# Patient Record
Sex: Female | Born: 1985 | Hispanic: Yes | Marital: Married | Smoking: Former smoker
Health system: Southern US, Community
[De-identification: ages and names within clinical notes are randomized; demographics above are authoritative.]

## PROBLEM LIST (undated history)

## (undated) DIAGNOSIS — E785 Hyperlipidemia, unspecified: Secondary | ICD-10-CM

## (undated) DIAGNOSIS — N200 Calculus of kidney: Secondary | ICD-10-CM

## (undated) DIAGNOSIS — K219 Gastro-esophageal reflux disease without esophagitis: Secondary | ICD-10-CM

## (undated) DIAGNOSIS — E119 Type 2 diabetes mellitus without complications: Secondary | ICD-10-CM

## (undated) HISTORY — DX: Hyperlipidemia, unspecified: E78.5

## (undated) HISTORY — DX: Type 2 diabetes mellitus without complications: E11.9

## (undated) HISTORY — DX: Gastro-esophageal reflux disease without esophagitis: K21.9

## (undated) HISTORY — PX: OTHER SURGICAL HISTORY: SHX169

## (undated) HISTORY — DX: Calculus of kidney: N20.0

---

## 2015-12-29 ENCOUNTER — Other Ambulatory Visit (HOSPITAL_COMMUNITY)
Admission: RE | Admit: 2015-12-29 | Discharge: 2015-12-29 | Disposition: A | Discharge status: Home / Self Care | Payer: BLUE CROSS/BLUE SHIELD | Source: Ambulatory Visit | Attending: Obstetrics & Gynecology | Admitting: Obstetrics & Gynecology

## 2015-12-29 DIAGNOSIS — Z029 Encounter for administrative examinations, unspecified: Secondary | ICD-10-CM | POA: Insufficient documentation

## 2015-12-30 LAB — PROGESTERONE: PROGESTERONE: 0.2 ng/mL

## 2016-02-15 DIAGNOSIS — F331 Major depressive disorder, recurrent, moderate: Secondary | ICD-10-CM | POA: Diagnosis not present

## 2016-09-10 DIAGNOSIS — H5211 Myopia, right eye: Secondary | ICD-10-CM | POA: Diagnosis not present

## 2016-09-14 ENCOUNTER — Emergency Department (HOSPITAL_COMMUNITY): Payer: BLUE CROSS/BLUE SHIELD

## 2016-09-14 ENCOUNTER — Encounter (HOSPITAL_COMMUNITY): Payer: Self-pay

## 2016-09-14 ENCOUNTER — Emergency Department (HOSPITAL_COMMUNITY)
Admission: EM | Admit: 2016-09-14 | Discharge: 2016-09-14 | Disposition: A | Discharge status: Home / Self Care | Payer: BLUE CROSS/BLUE SHIELD | Attending: Emergency Medicine | Admitting: Emergency Medicine

## 2016-09-14 DIAGNOSIS — R1011 Right upper quadrant pain: Secondary | ICD-10-CM | POA: Diagnosis not present

## 2016-09-14 DIAGNOSIS — R7989 Other specified abnormal findings of blood chemistry: Secondary | ICD-10-CM

## 2016-09-14 DIAGNOSIS — K760 Fatty (change of) liver, not elsewhere classified: Secondary | ICD-10-CM

## 2016-09-14 DIAGNOSIS — F17200 Nicotine dependence, unspecified, uncomplicated: Secondary | ICD-10-CM | POA: Insufficient documentation

## 2016-09-14 DIAGNOSIS — R945 Abnormal results of liver function studies: Secondary | ICD-10-CM | POA: Diagnosis not present

## 2016-09-14 DIAGNOSIS — R1012 Left upper quadrant pain: Secondary | ICD-10-CM | POA: Diagnosis not present

## 2016-09-14 DIAGNOSIS — R1030 Lower abdominal pain, unspecified: Secondary | ICD-10-CM | POA: Diagnosis present

## 2016-09-14 DIAGNOSIS — N200 Calculus of kidney: Secondary | ICD-10-CM

## 2016-09-14 DIAGNOSIS — N1330 Unspecified hydronephrosis: Secondary | ICD-10-CM | POA: Diagnosis not present

## 2016-09-14 LAB — URINALYSIS, ROUTINE W REFLEX MICROSCOPIC
Bilirubin Urine: NEGATIVE
GLUCOSE, UA: NEGATIVE mg/dL
KETONES UR: NEGATIVE mg/dL
Leukocytes, UA: NEGATIVE
Nitrite: NEGATIVE
PH: 5.5 (ref 5.0–8.0)
PROTEIN: NEGATIVE mg/dL
Specific Gravity, Urine: 1.016 (ref 1.005–1.030)

## 2016-09-14 LAB — COMPREHENSIVE METABOLIC PANEL
ALBUMIN: 3.8 g/dL (ref 3.5–5.0)
ALT: 107 U/L — AB (ref 14–54)
AST: 46 U/L — AB (ref 15–41)
Alkaline Phosphatase: 76 U/L (ref 38–126)
Anion gap: 8 (ref 5–15)
BUN: 7 mg/dL (ref 6–20)
CHLORIDE: 105 mmol/L (ref 101–111)
CO2: 23 mmol/L (ref 22–32)
CREATININE: 0.79 mg/dL (ref 0.44–1.00)
Calcium: 8.6 mg/dL — ABNORMAL LOW (ref 8.9–10.3)
GFR calc Af Amer: >60 mL/min (ref >60)
GFR calc non Af Amer: >60 mL/min (ref >60)
Glucose, Bld: 137 mg/dL — ABNORMAL HIGH (ref 65–99)
Potassium: 4.3 mmol/L (ref 3.5–5.1)
SODIUM: 136 mmol/L (ref 135–145)
Total Bilirubin: 0.4 mg/dL (ref 0.3–1.2)
Total Protein: 6.6 g/dL (ref 6.5–8.1)

## 2016-09-14 LAB — URINE MICROSCOPIC-ADD ON

## 2016-09-14 LAB — LIPASE, BLOOD: LIPASE: 32 U/L (ref 11–51)

## 2016-09-14 LAB — CBC
HCT: 46.2 % — ABNORMAL HIGH (ref 36.0–46.0)
Hemoglobin: 15.9 g/dL — ABNORMAL HIGH (ref 12.0–15.0)
MCH: 31.7 pg (ref 26.0–34.0)
MCHC: 34.4 g/dL (ref 30.0–36.0)
MCV: 92.2 fL (ref 78.0–100.0)
PLATELETS: 252 10*3/uL (ref 150–400)
RBC: 5.01 MIL/uL (ref 3.87–5.11)
RDW: 12.4 % (ref 11.5–15.5)
WBC: 10 10*3/uL (ref 4.0–10.5)

## 2016-09-14 LAB — I-STAT BETA HCG BLOOD, ED (MC, WL, AP ONLY): I-stat hCG, quantitative: <5 m[IU]/mL (ref <5)

## 2016-09-14 MED ORDER — TAMSULOSIN HCL 0.4 MG PO CAPS: 0.4000 mg | ORAL_CAPSULE | Freq: Every day | ORAL | 0 refills | Status: DC

## 2016-09-14 MED ORDER — HYDROCODONE-ACETAMINOPHEN 5-325 MG PO TABS: 2.0000 | ORAL_TABLET | ORAL | 0 refills | Status: DC | PRN

## 2016-09-14 MED ORDER — KETOROLAC TROMETHAMINE 30 MG/ML IJ SOLN
30.0000 mg | Freq: Once | INTRAMUSCULAR | Status: AC
  Administered 2016-09-14: 30 mg via INTRAVENOUS
  Filled 2016-09-14: qty 1

## 2016-09-14 MED ORDER — MORPHINE SULFATE (PF) 4 MG/ML IV SOLN
2.0000 mg | Freq: Once | INTRAVENOUS | Status: AC
  Administered 2016-09-14: 2 mg via INTRAVENOUS
  Filled 2016-09-14: qty 1

## 2016-09-14 MED ORDER — ONDANSETRON HCL 4 MG/2ML IJ SOLN
4.0000 mg | Freq: Once | INTRAMUSCULAR | Status: AC
  Administered 2016-09-14: 4 mg via INTRAVENOUS
  Filled 2016-09-14: qty 2

## 2016-09-14 NOTE — Discharge Instructions (Signed)
Take flomax daily. Use urine strainer to attemp to collect kidney stone. Take pain medication as needed. Follow up with urology in 1-2 weeks for re-evaluation. Return to the ED if you experience severe worsening of your symptoms, vomiting, fever, inability to urinate.

## 2016-09-14 NOTE — ED Provider Notes (Signed)
[facility]-EMERGENCY DEPT Provider Note   CSN: [id number] Arrival date & time: 09/14/16  0601     History   Chief Complaint Chief Complaint  Patient presents with  . Abdominal Pain    HPI Stacey Morales is a 30 y.o. female with no significant pmhx who presents to the ED today c/o abdominal pain. Patient states that yesterday around noon she began having lower abdominal cramping that felt like her menstrual cramps. She states she is about to start her period. Otherwise she is feeling fine until around 520 this when she was awakened with sharp right upper quadrant pain. Pain has been intermittent since then and is worsened with movement. She reports nausea and 1 episode of nonbloody and nonbilious emesis this morning. She denies any diarrhea, fevers, dysuria, vaginal discharge. Patient reports a significant fatty diet. Last night for dinner she had [place] food and popcorn. She reports occasional alcohol consumption. No previous abdominal surgeries.  HPI  History reviewed. No pertinent past medical history.  There are no active problems to display for this patient.   History reviewed. No pertinent surgical history.  OB History    No data available       Home Medications    Prior to Admission medications   Not on File    Family History No family history on file.  Social History Social History  Substance Use Topics  . Smoking status: Current Some Day Smoker  . Smokeless tobacco: Never Used  . Alcohol use Yes     Comment: occasionally      Allergies   Patient has no known allergies.   Review of Systems Review of Systems  All other systems reviewed and are negative.    Physical Exam Updated Vital Signs BP 123/83   Pulse 74   Ht 5\' 1"  (1.549 m)   Wt 77.1 kg   SpO2 96%   BMI 32.12 kg/m   Physical Exam  Constitutional: She is oriented to person, place, and time. She appears well-developed and well-nourished. No distress.  HENT:  Head: Normocephalic and  atraumatic.  Mouth/Throat: No oropharyngeal exudate.  Eyes: Conjunctivae and EOM are normal. Pupils are equal, round, and reactive to light. Right eye exhibits no discharge. Left eye exhibits no discharge. No scleral icterus.  Cardiovascular: Normal rate, regular rhythm, normal heart sounds and intact distal pulses.  Exam reveals no gallop and no friction rub.   No murmur heard. Pulmonary/Chest: Effort normal and breath sounds normal. No respiratory distress. She has no wheezes. She has no rales. She exhibits no tenderness.  Abdominal: Soft. Bowel sounds are normal. She exhibits no distension. There is tenderness ( mild RUQ). There is no guarding.  No CVA tenderness   Musculoskeletal: Normal range of motion. She exhibits no edema.  Neurological: She is alert and oriented to person, place, and time.  Skin: Skin is warm and dry. No rash noted. She is not diaphoretic. No erythema. No pallor.  Psychiatric: She has a normal mood and affect. Her behavior is normal.  Nursing note and vitals reviewed.    ED Treatments / Results  Labs (all labs ordered are listed, but only abnormal results are displayed) Labs Reviewed  COMPREHENSIVE METABOLIC PANEL - Abnormal; Notable for the following:       Result Value   Glucose, Bld 137 (*)    Calcium 8.6 (*)    AST 46 (*)    ALT 107 (*)    All other components within normal limits  CBC - Abnormal;  Notable for the following:    Hemoglobin 15.9 (*)    HCT 46.2 (*)    All other components within normal limits  URINALYSIS, ROUTINE W REFLEX MICROSCOPIC (NOT AT [facility]) - Abnormal; Notable for the following:    APPearance CLOUDY (*)    Hgb urine dipstick LARGE (*)    All other components within normal limits  URINE MICROSCOPIC-ADD ON - Abnormal; Notable for the following:    Squamous Epithelial / LPF 0-5 (*)    Bacteria, UA RARE (*)    All other components within normal limits  LIPASE, BLOOD  I-STAT BETA HCG BLOOD, ED ([facility], [facility], [facility] ONLY)    EKG  EKG  Interpretation None       Radiology Ct Renal Stone Study  Result Date: 09/14/2016 CLINICAL DATA:  Left upper quadrant pain. EXAM: CT ABDOMEN AND PELVIS WITHOUT CONTRAST TECHNIQUE: Multidetector CT imaging of the abdomen and pelvis was performed following the standard protocol without IV contrast. COMPARISON:  None. FINDINGS: Lower chest: The lung bases are clear. Hepatobiliary: Hepatic steatosis. No focal liver abnormalities. The gallbladder appears normal. No biliary dilatation. Pancreas: Unremarkable. No pancreatic ductal dilatation or surrounding inflammatory changes. Spleen: Normal in size without focal abnormality. Adrenals/Urinary Tract: Normal appearance of the left kidney. There is right-sided hydronephrosis and hydroureter. There is a stone within the mid right ureter which measures 3 mm, image 54 of series 2. The urinary bladder is normal. Stomach/Bowel: Stomach is within normal limits. Appendix appears normal. No evidence of bowel wall thickening, distention, or inflammatory changes. Vascular/Lymphatic: No significant vascular findings are present. No enlarged abdominal or pelvic lymph nodes. Reproductive: Uterus and bilateral adnexa are unremarkable. Other: No abdominal wall hernia or abnormality. No abdominopelvic ascites. Musculoskeletal: No acute or significant osseous findings. IMPRESSION: 1. Mid right ureteral calculus measures approximately 3 mm 2. Hepatic steatosis. Electronically Signed   By: Signa Kellaylor  Stroud M.D.   On: 09/14/2016 09:46   [place] Abdomen Limited Ruq  Result Date: 09/14/2016 CLINICAL DATA:  Abdominal pain, nausea EXAM: US ABDOMEN LIMITED - RIGHT UPPER QUADRANT COMPARISON:  None. FINDINGS: Gallbladder: No shadowing gallstones are noted within gallbladder. Gallbladder wall polyp measures 4 x 3 mm. No thickening of gallbladder wall. No sonographic Murphy's sign. Common bile duct: Diameter: 3 mm in diameter within normal limits Liver: No focal hepatic mass. The liver shows  increased echogenicity suspicious for fatty infiltration. Incidental note is made of right hydronephrosis. IMPRESSION: 1. No gallstones are noted within gallbladder. Gallbladder wall polyp measures 4 mm. 2. Normal CBD. 3. Fatty infiltration of the liver. 4. Mild right hydronephrosis. Electronically Signed   By: Natasha MeadLiviu  Pop M.D.   On: 09/14/2016 08:54    Procedures Procedures (including critical care time)  Medications Ordered in ED Medications  morphine 4 MG/ML injection 2 mg (not administered)     Initial Impression / Assessment and Plan / ED Course  I have reviewed the triage vital signs and the nursing notes.  Pertinent labs & imaging results that were available during my care of the patient were reviewed by me and considered in my medical decision making (see chart for details).  Clinical Course     Otherwise healthy 30 year old female presents to the ED today complaining of acute onset right upper quadrant abdominal pain this morning around 5:20 AM. Pain is colicky in nature and she has associated nausea, 1 episode of emesis. On presentation to ED patient overall appears well. She is nontoxic and nonseptic appearing, in no apparent distress. Vital signs  are stable. She has mild right upper quadrant TTP on exam. She reports heavy, fatty diet. Concern for gallbladder etiology. She has mild elevation in her LFTs. AST is 46, ALT is 107. All other lab work unremarkable. Right upper quadrant ultrasound revealed no gallstones, normal CBD. She does have fatty infiltration of the liver and mild right hydronephrosis seen on exam. We'll obtain CT renal stone study to confirm stone as source for hydronephrosis. Patient was given Toradol with significant symptomatic relief. Pain now well controlled.  CT revealed mid right ureteral calculus measuring approximately 3 mm. Will DC to home with pain medication, Flomax. Recommend follow-up with urology in 1-2 weeks for re-eval. We'll also send her home with a  urine strainer. Return precautions outlined in patient discharge instructions.   Final Clinical Impressions(s) / ED Diagnoses   Final diagnoses:  RUQ abdominal pain    New Prescriptions New Prescriptions   No medications on file     Dub Mikes, PA-C 09/14/16 1035    Azalia Bilis, MD 09/17/16 [phone]

## 2016-09-14 NOTE — ED Notes (Signed)
Papers and medications reviewed with patient. IV removed and strainer and hat given to patient

## 2016-09-14 NOTE — ED Triage Notes (Signed)
RUQ pain since 0520. Pt reports 1 episode of emesis.

## 2016-09-14 NOTE — ED Notes (Signed)
Pt given strainer and hat to catch urine and  Container for stone labled with pts sticker

## 2016-09-26 ENCOUNTER — Ambulatory Visit (INDEPENDENT_AMBULATORY_CARE_PROVIDER_SITE_OTHER): Payer: BLUE CROSS/BLUE SHIELD | Admitting: Physician Assistant

## 2016-09-26 VITALS — BP 148/94 | HR 71 | Temp 98.6°F | Resp 18 | Ht 61.25 in | Wt 173.0 lb

## 2016-09-26 DIAGNOSIS — N200 Calculus of kidney: Secondary | ICD-10-CM

## 2016-09-26 DIAGNOSIS — R109 Unspecified abdominal pain: Secondary | ICD-10-CM | POA: Diagnosis not present

## 2016-09-26 LAB — POCT URINALYSIS DIP (MANUAL ENTRY)
BILIRUBIN UA: NEGATIVE
GLUCOSE UA: NEGATIVE
Ketones, POC UA: NEGATIVE
LEUKOCYTES UA: NEGATIVE
NITRITE UA: NEGATIVE
PH UA: 8.5
Protein Ur, POC: NEGATIVE
Spec Grav, UA: 1.02
Urobilinogen, UA: 0.2

## 2016-09-26 LAB — POC MICROSCOPIC URINALYSIS (UMFC): Mucus: ABSENT

## 2016-09-26 LAB — POCT URINE PREGNANCY: PREG TEST UR: NEGATIVE

## 2016-09-26 MED ORDER — ONDANSETRON HCL 4 MG PO TABS: 4.0000 mg | ORAL_TABLET | Freq: Three times a day (TID) | ORAL | 0 refills | Status: DC | PRN

## 2016-09-26 MED ORDER — KETOROLAC TROMETHAMINE 60 MG/2ML IM SOLN
60.0000 mg | Freq: Once | INTRAMUSCULAR | Status: AC
  Administered 2016-09-26: 60 mg via INTRAMUSCULAR

## 2016-09-26 MED ORDER — HYDROCODONE-ACETAMINOPHEN 5-325 MG PO TABS: 1.0000 | ORAL_TABLET | ORAL | 0 refills | Status: DC | PRN

## 2016-09-26 MED ORDER — NAPROXEN 500 MG PO TABS: 500.0000 mg | ORAL_TABLET | Freq: Two times a day (BID) | ORAL | 0 refills | Status: DC

## 2016-09-26 NOTE — Patient Instructions (Signed)
     IF you received an x-ray today, you will receive an invoice from [facility]. Please contact [facility] at [phone] with questions or concerns regarding your invoice.   IF you received labwork today, you will receive an invoice from [facility]. Please contact [facility] at [phone] with questions or concerns regarding your invoice.   Our billing staff will not be able to assist you with questions regarding bills from these companies.  You will be contacted with the lab results as soon as they are available. The fastest way to get your results is to activate your My Chart account. Instructions are located on the last page of this paperwork. If you have not heard from us regarding the results in 2 weeks, please contact this office.     

## 2016-09-26 NOTE — Progress Notes (Signed)
09/30/2016 9:49 PM   DOB: 07/16/1986 / MRN: [id number]  SUBJECTIVE:  Stacey Morales is a 30 y.o. female presenting for flank on the right side with radiation to the right lower quadrant.  Was seen in the ED 12 days ago and was diagnosed with nephrolithiasis.  CT impression included below. Reports she was symptoms free upon leaving the ED until today at which time the symptoms came back and are now worse.  She associates dysuria today and had one episode of non bloody emesis today. She has taken 5-325 norco today and this has helped her symptoms.  She does not use protection and is sexually active with her female partner.   She has No Known Allergies.   She  has no past medical history on file.    She  reports that she has been smoking.  She has never used smokeless tobacco. She reports that she drinks alcohol. She reports that she does not use drugs. She  has no sexual activity history on file. The patient  has no past surgical history on file.  Her family history is not on file.  Review of Systems  Constitutional: Negative for chills and fever.  Respiratory: Negative for cough.   Gastrointestinal: Positive for abdominal pain, nausea and vomiting. Negative for blood in stool, constipation, diarrhea, heartburn and melena.  Genitourinary: Positive for dysuria.  Skin: Negative for itching.  Neurological: Negative for dizziness.  Psychiatric/Behavioral: Negative for depression.    The problem list and medications were reviewed and updated by myself where necessary and exist elsewhere in the encounter.   OBJECTIVE:  BP (!) 148/94 (BP Location: Right Arm, Patient Position: Sitting, Cuff Size: Large)   Pulse 71   Temp 98.6 F (37 C) (Oral)   Resp 18   Ht 5' 1.25" (1.556 m)   Wt 173 lb (78.5 kg)   LMP 09/22/2016 Comment: Irregular  SpO2 99%   BMI 32.42 kg/m   Physical Exam  Constitutional: She is oriented to person, place, and time.  Cardiovascular: Normal rate and regular rhythm.     Pulmonary/Chest: Effort normal and breath sounds normal.  Abdominal: Soft. Bowel sounds are normal. She exhibits no distension and no mass. There is no tenderness. There is no rebound, no guarding and no CVA tenderness.  Musculoskeletal: Normal range of motion.  Neurological: She is alert and oriented to person, place, and time.    No results found for this or any previous visit (from the past 72 hour(s)). Lab Results  Component Value Date   CREATININE 1.04 (H) 09/26/2016   CT renal stone study on 09/14/16  IMPRESSION: 1. Mid right ureteral calculus measures approximately 3 mm 2. Hepatic steatosis.  No results found.  ASSESSMENT AND PLAN  Stacey Morales was seen today for back pain.  Diagnoses and all orders for this visit:  Nephrolithiasis Comments: See previous CT. 3 mm stone with hydronephrosis and hydroureter.  The pain had resolved and returned today. Will continue conservative management with plan.  Orders: -     ondansetron (ZOFRAN) 4 MG tablet; Take 1 tablet (4 mg total) by mouth every 8 (eight) hours as needed for nausea or vomiting. -     HYDROcodone-acetaminophen (NORCO/VICODIN) 5-325 MG tablet; Take 1-2 tablets by mouth every 4 (four) hours as needed. -     naproxen (NAPROSYN) 500 MG tablet; Take 1 tablet (500 mg total) by mouth 2 (two) times daily with a meal.  Flank pain Comments: Resolved with Toradol, 1 liter of NS.  Orders: -     POCT urinalysis dipstick -     POCT Microscopic Urinalysis ([facility]) -     CMP14+EGFR -     POCT urine pregnancy -     ketorolac (TORADOL) injection 60 mg; Inject 2 mLs (60 mg total) into the muscle once.    The patient is advised to call or return to clinic if she does not see an improvement in symptoms, or to seek the care of the closest emergency department if she worsens with the above plan.   Philis Fendt, MHS, PA-C [facility] 09/30/2016 9:49 PM

## 2016-09-27 LAB — CMP14+EGFR
ALBUMIN: 4.5 g/dL (ref 3.5–5.5)
ALK PHOS: 85 IU/L (ref 39–117)
ALT: 111 IU/L — ABNORMAL HIGH (ref 0–32)
AST: 57 IU/L — AB (ref 0–40)
Albumin/Globulin Ratio: 1.4 (ref 1.2–2.2)
BILIRUBIN TOTAL: 0.3 mg/dL (ref 0.0–1.2)
BUN / CREAT RATIO: 6 — AB (ref 9–23)
BUN: 6 mg/dL (ref 6–20)
CHLORIDE: 103 mmol/L (ref 96–106)
CO2: 22 mmol/L (ref 18–29)
Calcium: 9.4 mg/dL (ref 8.7–10.2)
Creatinine, Ser: 1.04 mg/dL — ABNORMAL HIGH (ref 0.57–1.00)
GFR calc Af Amer: 84 mL/min/{1.73_m2} (ref >59)
GFR calc non Af Amer: 73 mL/min/{1.73_m2} (ref >59)
GLOBULIN, TOTAL: 3.2 g/dL (ref 1.5–4.5)
GLUCOSE: 114 mg/dL — AB (ref 65–99)
Potassium: 4.3 mmol/L (ref 3.5–5.2)
SODIUM: 142 mmol/L (ref 134–144)
Total Protein: 7.7 g/dL (ref 6.0–8.5)

## 2016-09-30 NOTE — Progress Notes (Signed)
Hi Jill please let this patient know I want to see her back in clinic if she is not feeling better.  Her creatinine is slightly elevated. Fortunately her GFR is normal.  Deliah BostonMichael Clark, MS, PA-C 9:51 PM, 09/30/2016

## 2016-10-13 ENCOUNTER — Telehealth: Payer: Self-pay | Admitting: Emergency Medicine

## 2016-10-13 NOTE — Telephone Encounter (Signed)
-----   Message from Michael L Clark, PA-C sent at 09/30/2016  9:51 PM EST ----- Hi Lalita Ebel please let this patient know I want to see her back in clinic if she is not feeling better.  Her creatinine is slightly elevated. Fortunately her GFR is normal.  Michael Clark, MS, PA-C 9:51 PM, 09/30/2016 

## 2016-10-31 ENCOUNTER — Telehealth: Payer: Self-pay | Admitting: Emergency Medicine

## 2016-10-31 NOTE — Telephone Encounter (Signed)
-----   Message from Ofilia NeasMichael L Clark, PA-C sent at 09/30/2016  9:51 PM EST ----- Renea EeHi Oakes Mccready please let this patient know I want to see her back in clinic if she is not feeling better.  Her creatinine is slightly elevated. Fortunately her GFR is normal.  Deliah BostonMichael Clark, MS, PA-C 9:51 PM, 09/30/2016

## 2017-02-09 DIAGNOSIS — B86 Scabies: Secondary | ICD-10-CM | POA: Diagnosis not present

## 2017-02-09 DIAGNOSIS — D485 Neoplasm of uncertain behavior of skin: Secondary | ICD-10-CM | POA: Diagnosis not present

## 2017-02-09 DIAGNOSIS — B079 Viral wart, unspecified: Secondary | ICD-10-CM | POA: Diagnosis not present

## 2017-03-02 DIAGNOSIS — B078 Other viral warts: Secondary | ICD-10-CM | POA: Diagnosis not present

## 2017-03-23 DIAGNOSIS — B078 Other viral warts: Secondary | ICD-10-CM | POA: Diagnosis not present

## 2017-04-13 ENCOUNTER — Ambulatory Visit (INDEPENDENT_AMBULATORY_CARE_PROVIDER_SITE_OTHER): Payer: BLUE CROSS/BLUE SHIELD | Admitting: Obstetrics & Gynecology

## 2017-04-13 ENCOUNTER — Encounter: Payer: Self-pay | Admitting: Obstetrics & Gynecology

## 2017-04-13 ENCOUNTER — Other Ambulatory Visit: Payer: Self-pay | Admitting: Obstetrics & Gynecology

## 2017-04-13 VITALS — BP 118/86 | Ht 61.0 in | Wt 170.0 lb

## 2017-04-13 DIAGNOSIS — Z1151 Encounter for screening for human papillomavirus (HPV): Secondary | ICD-10-CM | POA: Diagnosis not present

## 2017-04-13 DIAGNOSIS — Z01419 Encounter for gynecological examination (general) (routine) without abnormal findings: Secondary | ICD-10-CM | POA: Diagnosis not present

## 2017-04-13 DIAGNOSIS — N926 Irregular menstruation, unspecified: Secondary | ICD-10-CM

## 2017-04-13 DIAGNOSIS — N979 Female infertility, unspecified: Secondary | ICD-10-CM

## 2017-04-13 DIAGNOSIS — Z113 Encounter for screening for infections with a predominantly sexual mode of transmission: Secondary | ICD-10-CM

## 2017-04-13 NOTE — Patient Instructions (Signed)
1. Encounter for routine gynecological examination with Papanicolaou smear of cervix Normal gyn exam.  Pap/HPV HR done.  Breasts wnl. - Pap IG, CT/NG NAA, and HPV (high risk)  2. Primary female infertility Many years of attempting conception with husband without success.  No investigation yet.  Planning consultation with Fertility specialist.  Will start work-up with evaluation of ovulation and tubal patency.  Lab work today.  HSG under Doxy after next menses.  [facility]. - Prolactin - TSH - Hemoglobin A1c - Progesterone; Future  3. Screen for STD (sexually transmitted disease) Full STD work up done given h/o Chlam 2 yrs ago.   - HIV antibody - RPR - Hepatitis C Antibody - Hepatitis B Surface AntiGEN - Pap IG, CT/NG NAA, and HPV (high risk)  4. Irregular menses Evaluate for possible anovulation.  Wt loss with low carb diet and regular physical activity discussed.  Better chance of ovulatory cycles with normal BMI explained.  Current BMI 32.12, obesity. - Prolactin - TSH - Hemoglobin A1c - Progesterone; Future  5. Special screening examination for human papillomavirus (HPV)  - Pap IG, CT/NG NAA, and HPV (high risk)  Stacey RiegerLaura, it was a pleasure to meet you today!  I will inform you of your results as soon as available.

## 2017-04-13 NOTE — Progress Notes (Signed)
Stacey Morales Apr 10, 1986 [id number]   History:    31 y.o.  G0 Married (together x 13 yrs)  RP:  New patient presenting for annual gyn exam and primary infertility  HPI:  Menses more regular qmonth as patient is loosing weight.  Currently 170 Lbs, BMI 32.12.  Would like to conceive, primary infertility, scheduled for first visit at [facility].  H/O Chlamydia 2 yrs ago, treated PO.  No HSG done.  No sperm analysis done.  Past medical history,surgical history, family history and social history were all reviewed and documented in the EPIC chart.  Gynecologic History Patient's last menstrual period was 03/16/2017. Contraception: none Last Pap: Not up to date, no previous abnormal pap Last mammogram: Never  Obstetric History OB History  Gravida Para Term Preterm AB Living  0 0 0 0 0 0  SAB TAB Ectopic Multiple Live Births  0 0 0 0 0         ROS: A ROS was performed and pertinent positives and negatives are included in the history.  GENERAL: No fevers or chills. HEENT: No change in vision, no earache, sore throat or sinus congestion. NECK: No pain or stiffness. CARDIOVASCULAR: No chest pain or pressure. No palpitations. PULMONARY: No shortness of breath, cough or wheeze. GASTROINTESTINAL: No abdominal pain, nausea, vomiting or diarrhea, melena or bright red blood per rectum. GENITOURINARY: No urinary frequency, urgency, hesitancy or dysuria. MUSCULOSKELETAL: No joint or muscle pain, no back pain, no recent trauma. DERMATOLOGIC: No rash, no itching, no lesions. ENDOCRINE: No polyuria, polydipsia, no heat or cold intolerance. No recent change in weight. HEMATOLOGICAL: No anemia or easy bruising or bleeding. NEUROLOGIC: No headache, seizures, numbness, tingling or weakness. PSYCHIATRIC: No depression, no loss of interest in normal activity or change in sleep pattern.     Exam:   BP 118/86   Ht 5\' 1"  (1.549 m)   Wt 170 lb (77.1 kg)   LMP 03/16/2017   BMI 32.12 kg/m    Body mass index is 32.12 kg/m.  General appearance : Well developed well nourished female. No acute distress HEENT: Eyes: no retinal hemorrhage or exudates,  Neck supple, trachea midline, no carotid bruits, no thyroidmegaly Lungs: Clear to auscultation, no rhonchi or wheezes, or rib retractions  Heart: Regular rate and rhythm, no murmurs or gallops Breast:Examined in sitting and supine position were symmetrical in appearance, no palpable masses or tenderness,  no skin retraction, no nipple inversion, no nipple discharge, no skin discoloration, no axillary or supraclavicular lymphadenopathy Abdomen: no palpable masses or tenderness, no rebound or guarding Extremities: no edema or skin discoloration or tenderness  Pelvic:  Bartholin, Urethra, Skene Glands: Within normal limits             Vagina: No gross lesions or discharge  Cervix: No gross lesions or discharge.  Pap/HPV HR/Gono-Chlam done.  Uterus  AV, normal size, shape and consistency, non-tender and mobile  Adnexa  Without masses or tenderness  Anus and perineum  normal      Assessment/Plan:  31 y.o. female for annual exam   1. Encounter for routine gynecological examination with Papanicolaou smear of cervix Normal gyn exam.  Pap/HPV HR done.  Breasts wnl. - Pap IG, CT/NG NAA, and HPV (high risk)  2. Primary female infertility Many years of attempting conception with husband without success.  No investigation yet.  Planning consultation with Fertility specialist.  Will start work-up with evaluation of ovulation and tubal patency.  Lab work today.  HSG  under Doxy after next menses.  [facility] - Prolactin - TSH - Hemoglobin A1c - Progesterone; Future  3. Screen for STD (sexually transmitted disease) Full STD work up done given h/o Chlam 2 yrs ago.   - HIV antibody - RPR - Hepatitis C Antibody - Hepatitis B Surface AntiGEN - Pap IG, CT/NG NAA, and HPV (high risk)  4. Irregular menses Evaluate  for possible anovulation.  Wt loss with low carb diet and regular physical activity discussed.  Better chance of ovulatory cycles with normal BMI explained.  Current BMI 32.12, obesity. - Prolactin - TSH - Hemoglobin A1c - Progesterone; Future  5. Special screening examination for human papillomavirus (HPV)  - Pap IG, CT/NG NAA, and HPV (high risk)   Counseling on above issues >50% x 30 minutes.  Genia DelMarie-Lyne Deann Mclaine MD, 2:45 PM 04/13/2017

## 2017-04-15 DIAGNOSIS — B078 Other viral warts: Secondary | ICD-10-CM | POA: Diagnosis not present

## 2017-04-19 LAB — PAP IG, CT-NG NAA, HPV HIGH-RISK
CHLAMYDIA PROBE AMP: NOT DETECTED
GC PROBE AMP: NOT DETECTED
HPV DNA High Risk: DETECTED — AB

## 2017-04-26 LAB — HPV TYPE 16 AND 18/45 RNA
HPV Type 16 RNA: NOT DETECTED
HPV Type 18/45 RNA: NOT DETECTED

## 2017-06-22 ENCOUNTER — Encounter (INDEPENDENT_AMBULATORY_CARE_PROVIDER_SITE_OTHER): Payer: BLUE CROSS/BLUE SHIELD

## 2017-07-01 ENCOUNTER — Ambulatory Visit (INDEPENDENT_AMBULATORY_CARE_PROVIDER_SITE_OTHER): Payer: BLUE CROSS/BLUE SHIELD | Admitting: Family Medicine

## 2017-07-01 ENCOUNTER — Encounter (INDEPENDENT_AMBULATORY_CARE_PROVIDER_SITE_OTHER): Payer: Self-pay | Admitting: Family Medicine

## 2017-07-01 VITALS — BP 120/75 | HR 83 | Temp 98.3°F | Ht 61.0 in | Wt 167.0 lb

## 2017-07-01 DIAGNOSIS — Z6831 Body mass index (BMI) 31.0-31.9, adult: Secondary | ICD-10-CM

## 2017-07-01 DIAGNOSIS — R0602 Shortness of breath: Secondary | ICD-10-CM

## 2017-07-01 DIAGNOSIS — E669 Obesity, unspecified: Secondary | ICD-10-CM | POA: Diagnosis not present

## 2017-07-01 DIAGNOSIS — E1159 Type 2 diabetes mellitus with other circulatory complications: Secondary | ICD-10-CM | POA: Insufficient documentation

## 2017-07-01 DIAGNOSIS — R5383 Other fatigue: Secondary | ICD-10-CM

## 2017-07-01 DIAGNOSIS — I10 Essential (primary) hypertension: Secondary | ICD-10-CM

## 2017-07-01 DIAGNOSIS — Z0289 Encounter for other administrative examinations: Secondary | ICD-10-CM

## 2017-07-01 DIAGNOSIS — Z1389 Encounter for screening for other disorder: Secondary | ICD-10-CM | POA: Diagnosis not present

## 2017-07-01 DIAGNOSIS — I152 Hypertension secondary to endocrine disorders: Secondary | ICD-10-CM | POA: Insufficient documentation

## 2017-07-01 DIAGNOSIS — Z1331 Encounter for screening for depression: Secondary | ICD-10-CM

## 2017-07-01 NOTE — Progress Notes (Signed)
.  Office: [phone]  /  Fax: [phone]   HPI:   Chief Complaint: OBESITY  Stacey Morales (MR# [id number]) is a 31 y.o. female who presents on 07/01/2017 for obesity evaluation and treatment. Current BMI is Body mass index is 31.55 kg/m.Marland Kitchen Stacey Morales has struggled with obesity for years and has been unsuccessful in either losing weight or maintaining long term weight loss. Stacey Morales attended our information session and states she is currently in the action stage of change and ready to dedicate time achieving and maintaining a healthier weight.  Stacey Morales states, she thinks she had bulimia in the past, (early teens) but no longer vomits or over restricts. Stacey Morales states her family eats meals together she thinks her family will eat healthier with  her she struggles with family and or coworkers weight loss sabotage her desired weight loss is 45 lbs she started gaining weight 31 yrs old her heaviest weight ever was 175 lbs. she is a picky eater and doesn't like to eat healthier foods  she has significant food cravings issues  she snacks frequently in the evenings she is frequently drinking liquids with calories she frequently makes poor food choices she frequently eats larger portions than normal  she has binge eating behaviors she struggles with emotional eating    Fatigue Stacey Morales feels her energy is lower than it should be. This has worsened with weight gain and has not worsened recently. Stacey Morales admits to daytime somnolence and admits to waking up still tired. Patient is at risk for obstructive sleep apnea. Patent has a history of symptoms of daytime fatigue and morning fatigue. Patient generally gets 8 hours of sleep per night, and states they generally have restless sleep. Snoring is present. Apneic episodes are present. Epworth Sleepiness Score is 6  Dyspnea on exertion Stacey Morales notes increasing shortness of breath with exercising and seems to be worsening over time with weight gain. She notes getting  out of breath sooner with activity than she used to. This has not gotten worse recently. Stacey Morales denies orthopnea.  Hypertension Stacey Morales is a 31 y.o. female with hypertension.  Stacey Morales denies chest pain or headache. She is working weight loss to help control her blood pressure with the goal of decreasing her risk of heart attack and stroke. [place] blood pressure is currently controlled on medications.   Depression Screen Stacey Morales (modified PHQ-9) score was  Depression screen PHQ 2/9 07/01/2017  Decreased Interest 2  Down, Depressed, Hopeless 2  PHQ - 2 Score 4  Altered sleeping 3  Tired, decreased energy 3  Change in appetite 3  Feeling bad or failure about yourself  3  Trouble concentrating 3  Moving slowly or fidgety/restless 0  Suicidal thoughts 0  PHQ-9 Score 19  Difficult doing work/chores Somewhat difficult    ALLERGIES: No Known Allergies  MEDICATIONS: No current outpatient prescriptions on file prior to visit.   No current facility-administered medications on file prior to visit.     PAST MEDICAL HISTORY: Past Medical History:  Diagnosis Date  . Anxiety   . Chest pain   . Constipation   . Depression   . Dyspnea   . GERD (gastroesophageal reflux disease)   . HTN (hypertension)   . Joint pain   . Kidney stone   . Overweight   . Palpitations   . Swallowing difficulty     PAST SURGICAL HISTORY: Past Surgical History:  Procedure Laterality Date  . arm surgery       SOCIAL  HISTORY: Social History  Substance Use Topics  . Smoking status: Former Smoker    Years: 14.00    Types: Cigarettes    Quit date: 02/11/2017  . Smokeless tobacco: Never Used  . Alcohol use Yes     Comment: occasionally     FAMILY HISTORY: Family History  Problem Relation Age of Onset  . Obesity Mother   . Diabetes Maternal Aunt   . Diabetes Maternal Uncle   . Diabetes Maternal Grandmother   . Heart attack Maternal Grandmother   . Diabetes Maternal  Grandfather     ROS: Review of Systems  Constitutional: Positive for malaise/fatigue.  HENT:       Difficult or Painful Swallowing  Eyes:       Vision Changes Wear Glasses or Contacts Blurry or Double Vision Floaters  Respiratory: Positive for shortness of breath (on exertion).   Cardiovascular: Positive for chest pain (chest pain/discomfort) and palpitations. Negative for orthopnea.       Calf/Leg Pain with Walking  Gastrointestinal: Positive for constipation, heartburn and nausea.       Swallowing Difficulty  Musculoskeletal:       Muscle or Joint Pain Muscle Stiffness  Neurological: Positive for weakness and headaches.  Endo/Heme/Allergies:       Heat/Cold Intolerance Polyphagia  Psychiatric/Behavioral: Positive for depression. The patient is nervous/anxious (nervousness).        Stress    PHYSICAL EXAM: Blood pressure 120/75, pulse 83, temperature 98.3 F (36.8 C), temperature source Oral, height  (1.549 m), weight 167 lb (75.8 kg), last menstrual period 06/27/2017, SpO2 96 %. Body mass index is 31.55 kg/m. Physical Exam  Constitutional: She is oriented to person, place, and time. She appears well-developed and well-nourished.  Cardiovascular: Normal rate.   Pulmonary/Chest: Effort normal.  Musculoskeletal: Normal range of motion.  Neurological: She is oriented to person, place, and time.  Skin: Skin is warm and dry.  Psychiatric: She has a normal Morales and affect. Her behavior is normal.  Vitals reviewed.   RECENT LABS AND TESTS: BMET    Component Value Date/Time   NA 142 09/26/2016 1529   K 4.3 09/26/2016 1529   CL 103 09/26/2016 1529   CO2 22 09/26/2016 1529   GLUCOSE 114 (H) 09/26/2016 1529   GLUCOSE 137 (H) 09/14/2016 0623   BUN 6 09/26/2016 1529   CREATININE 1.04 (H) 09/26/2016 1529   CALCIUM 9.4 09/26/2016 1529   GFRNONAA 73 09/26/2016 1529   GFRAA 84 09/26/2016 1529   No results found for: HGBA1C No results found for: INSULIN CBC      Component Value Date/Time   WBC 10.0 09/14/2016 0623   RBC 5.01 09/14/2016 0623   HGB 15.9 (H) 09/14/2016 0623   HCT 46.2 (H) 09/14/2016 0623   PLT 252 09/14/2016 0623   MCV 92.2 09/14/2016 0623   MCH 31.7 09/14/2016 0623   MCHC 34.4 09/14/2016 0623   RDW 12.4 09/14/2016 0623   Iron/TIBC/Ferritin/ %Sat No results found for: IRON, TIBC, FERRITIN, IRONPCTSAT Lipid Panel  No results found for: CHOL, TRIG, HDL, CHOLHDL, VLDL, LDLCALC, LDLDIRECT Hepatic Function Panel     Component Value Date/Time   PROT 7.7 09/26/2016 1529   ALBUMIN 4.5 09/26/2016 1529   AST 57 (H) 09/26/2016 1529   ALT 111 (H) 09/26/2016 1529   ALKPHOS 85 09/26/2016 1529   BILITOT 0.3 09/26/2016 1529   No results found for: TSH  ECG  shows NSR with a rate of 80 BPM INDIRECT CALORIMETER done today  shows a VO2 of 172 and a REE of 1199. Her calculated basal metabolic rate is 1610 thus her basal metabolic rate is worse than expected.    ASSESSMENT AND PLAN: Other fatigue - Plan: EKG 12-Lead, Vitamin B12, CBC With Differential, Folate, Insulin, random, Comprehensive metabolic panel, Lipid Panel With LDL/HDL Ratio, T3, T4, free, TSH, VITAMIN D 25 Hydroxy (Vit-D Deficiency, Fractures), Hemoglobin A1c  Shortness of breath on exertion  Essential hypertension - Plan: Lipid Panel With LDL/HDL Ratio  Depression screening  Class 1 obesity with serious comorbidity and body mass index (BMI) of 31.0 to 31.9 in adult, unspecified obesity type  PLAN:  Fatigue Stacey Morales was informed that her fatigue may be related to obesity, depression or many other causes. Labs will be ordered, and in the meanwhile Shadia has agreed to work on diet, exercise and weight loss to help with fatigue. Proper sleep hygiene was discussed including the need for 7-8 hours of quality sleep each night. A sleep study was not ordered based on symptoms and Epworth score.  Dyspnea on exertion Stacey Morales shortness of breath appears to be obesity related and  exercise induced. She has agreed to work on weight loss and gradually increase exercise to treat her exercise induced shortness of breath. If Stacey Morales follows our instructions and loses weight without improvement of her shortness of breath, we will plan to refer to pulmonology. We will monitor this condition regularly. Stacey Morales agrees to this plan.  Hypertension We discussed sodium restriction, working on healthy weight loss, and a regular exercise program as the means to achieve improved blood pressure control. Stacey Morales agreed with this plan and agreed to follow up as directed. We will continue to monitor her blood pressure as well as her progress with the above lifestyle modifications. She will continue her medications as prescribed and will watch for signs of hypotension as she continues her lifestyle modifications.  Depression Screen Stacey Morales had a strongly positive depression screening. Depression is commonly associated with obesity and often results in emotional eating behaviors. We will monitor this closely and work on CBT to help improve the non-hunger eating patterns. Referral to Psychology may be required if no improvement is seen as she continues in our clinic.  Obesity Stacey Morales is currently in the action stage of change and her goal is to continue with weight loss efforts She has agreed to follow the Pescatarian eating plan Stacey Morales has been instructed to work up to a goal of 150 minutes of combined cardio and strengthening exercise per week for weight loss and overall health benefits. We discussed the following Behavioral Modification Strategies today: increasing lean protein intake, decreasing simple carbohydrates , decreasing sodium intake and work on meal planning and easy cooking plans  Stacey Morales has agreed to follow up with our clinic in 2 weeks. She was informed of the importance of frequent follow up visits to maximize her success with intensive lifestyle modifications for her multiple health  conditions. She was informed we would discuss her lab results at her next visit unless there is a critical issue that needs to be addressed sooner. Mabrey agreed to keep her next visit at the agreed upon time to discuss these results.  I, [place] Crane, am acting as transcriptionist for Quillian Quince, MD  I have reviewed the above documentation for accuracy and completeness, and I agree with the above. -Quillian Quince, MD   OBESITY BEHAVIORAL INTERVENTION VISIT  Today's visit was # 1 out of 22.  Starting weight: 167 lbs Starting date: 07/01/17 Today's  weight : 167 lbs  Today's date: 07/01/2017 Total lbs lost to date: 0 (Patients must lose 7 lbs in the first 6 months to continue with counseling)   ASK: We discussed the diagnosis of obesity with Stacey Morales today and Stacey Morales agreed to give [place] permission to discuss obesity behavioral modification therapy today.  ASSESS: Stacey Morales has the diagnosis of obesity and her BMI today is 31.57 Charlii is in the action stage of change   ADVISE: Nance was educated on the multiple health risks of obesity as well as the benefit of weight loss to improve her health. She was advised of the need for long term treatment and the importance of lifestyle modifications.  AGREE: Multiple dietary modification options and treatment options were discussed and  Tyshauna agreed to follow the Pescatarian eating plan We discussed the following Behavioral Modification Strategies today: increasing lean protein intake, decreasing simple carbohydrates , decreasing sodium intake and work on meal planning and easy cooking plans

## 2017-07-02 ENCOUNTER — Ambulatory Visit (INDEPENDENT_AMBULATORY_CARE_PROVIDER_SITE_OTHER): Payer: BLUE CROSS/BLUE SHIELD | Admitting: Family Medicine

## 2017-07-02 ENCOUNTER — Encounter: Payer: Self-pay | Admitting: Family Medicine

## 2017-07-02 VITALS — BP 132/86 | HR 74 | Temp 98.6°F | Resp 17 | Ht 62.0 in | Wt 173.0 lb

## 2017-07-02 DIAGNOSIS — R079 Chest pain, unspecified: Secondary | ICD-10-CM

## 2017-07-02 DIAGNOSIS — F419 Anxiety disorder, unspecified: Secondary | ICD-10-CM

## 2017-07-02 DIAGNOSIS — F329 Major depressive disorder, single episode, unspecified: Secondary | ICD-10-CM

## 2017-07-02 DIAGNOSIS — F32A Depression, unspecified: Secondary | ICD-10-CM

## 2017-07-02 LAB — LIPID PANEL WITH LDL/HDL RATIO
CHOLESTEROL TOTAL: 211 mg/dL — AB (ref 100–199)
HDL: 38 mg/dL — ABNORMAL LOW (ref >39)
LDL CALC: 120 mg/dL — AB (ref 0–99)
LDL/HDL RATIO: 3.2 ratio (ref 0.0–3.2)
Triglycerides: 263 mg/dL — ABNORMAL HIGH (ref 0–149)
VLDL CHOLESTEROL CAL: 53 mg/dL — AB (ref 5–40)

## 2017-07-02 LAB — COMPREHENSIVE METABOLIC PANEL
ALBUMIN: 4.5 g/dL (ref 3.5–5.5)
ALK PHOS: 78 IU/L (ref 39–117)
ALT: 64 IU/L — ABNORMAL HIGH (ref 0–32)
AST: 46 IU/L — ABNORMAL HIGH (ref 0–40)
Albumin/Globulin Ratio: 1.7 (ref 1.2–2.2)
BUN / CREAT RATIO: 11 (ref 9–23)
BUN: 8 mg/dL (ref 6–20)
Bilirubin Total: 0.4 mg/dL (ref 0.0–1.2)
CO2: 23 mmol/L (ref 20–29)
CREATININE: 0.72 mg/dL (ref 0.57–1.00)
Calcium: 9.2 mg/dL (ref 8.7–10.2)
Chloride: 102 mmol/L (ref 96–106)
GFR, EST AFRICAN AMERICAN: 131 mL/min/{1.73_m2} (ref >59)
GFR, EST NON AFRICAN AMERICAN: 114 mL/min/{1.73_m2} (ref >59)
GLOBULIN, TOTAL: 2.7 g/dL (ref 1.5–4.5)
Glucose: 110 mg/dL — ABNORMAL HIGH (ref 65–99)
Potassium: 4.3 mmol/L (ref 3.5–5.2)
SODIUM: 139 mmol/L (ref 134–144)
TOTAL PROTEIN: 7.2 g/dL (ref 6.0–8.5)

## 2017-07-02 LAB — FOLATE: Folate: >20 ng/mL (ref >3.0)

## 2017-07-02 LAB — CBC WITH DIFFERENTIAL
BASOS: 0 %
Basophils Absolute: 0 10*3/uL (ref 0.0–0.2)
EOS (ABSOLUTE): 0.1 10*3/uL (ref 0.0–0.4)
EOS: 1 %
Hematocrit: 44.5 % (ref 34.0–46.6)
Hemoglobin: 14.9 g/dL (ref 11.1–15.9)
Immature Grans (Abs): 0 10*3/uL (ref 0.0–0.1)
Immature Granulocytes: 0 %
LYMPHS ABS: 2.7 10*3/uL (ref 0.7–3.1)
Lymphs: 32 %
MCH: 31 pg (ref 26.6–33.0)
MCHC: 33.5 g/dL (ref 31.5–35.7)
MCV: 93 fL (ref 79–97)
MONOCYTES: 5 %
MONOS ABS: 0.4 10*3/uL (ref 0.1–0.9)
NEUTROS PCT: 62 %
Neutrophils Absolute: 5.2 10*3/uL (ref 1.4–7.0)
RBC: 4.8 x10E6/uL (ref 3.77–5.28)
RDW: 13 % (ref 12.3–15.4)
WBC: 8.4 10*3/uL (ref 3.4–10.8)

## 2017-07-02 LAB — HEMOGLOBIN A1C
Est. average glucose Bld gHb Est-mCnc: 126 mg/dL
Hgb A1c MFr Bld: 6 % — ABNORMAL HIGH (ref 4.8–5.6)

## 2017-07-02 LAB — VITAMIN B12: Vitamin B-12: 332 pg/mL (ref 232–1245)

## 2017-07-02 LAB — TSH: TSH: 0.881 u[IU]/mL (ref 0.450–4.500)

## 2017-07-02 LAB — T4, FREE: FREE T4: 1.16 ng/dL (ref 0.82–1.77)

## 2017-07-02 LAB — T3: T3 TOTAL: 136 ng/dL (ref 71–180)

## 2017-07-02 LAB — VITAMIN D 25 HYDROXY (VIT D DEFICIENCY, FRACTURES): Vit D, 25-Hydroxy: 22.5 ng/mL — ABNORMAL LOW (ref 30.0–100.0)

## 2017-07-02 LAB — INSULIN, RANDOM: INSULIN: 132.1 u[IU]/mL — ABNORMAL HIGH (ref 2.6–24.9)

## 2017-07-02 NOTE — Patient Instructions (Addendum)
No medicines are being prescribed at this time. If you get worse please return.  I suspect that stress and a low-grade depression or your main issues at this time. I'm not placing her medication yet since you're desiring to get pregnant. However I do want you to get regular exercise, walking 30-60 minutes about 5 days a week at least.  Return at any time if abruptly worse, or if not improving you can always return for a reassessment.    IF you received an x-ray today, you will receive an invoice from [facility]. Please contact [facility] at [phone] with questions or concerns regarding your invoice.   IF you received labwork today, you will receive an invoice from [facility]. Please contact [facility] at [phone] with questions or concerns regarding your invoice.   Our billing staff will not be able to assist you with questions regarding bills from these companies.  You will be contacted with the lab results as soon as they are available. The fastest way to get your results is to activate your My Chart account. Instructions are located on the last page of this paperwork. If you have not heard from [place] regarding the results in 2 weeks, please contact this office.

## 2017-07-02 NOTE — Progress Notes (Signed)
Patient ID: Stacey Morales, female    DOB: [birth year]  Age: 31 y.o. MRN: [id number]  Chief Complaint  Patient presents with  . Chest Pain  . Depression    Subjective:   31 year old Hispanic female, understands English well but has a little difficulty speaking so her husband does a lot of the talking for her as an interpreter. She awakened during the night with pain in the left chest and weak sensation from her left side of her neck down her left arm. Has not had this before. No associated shortness of breath or nausea or vomiting.  She does admit to some depression, cries frequently. She works a job as a Radiographer, therapeutic. She does not get a lot of regular sinus though recently they have started doing some exercise with walking one hour a time or 2 a week. She smokes about one cigarette a week and has about 1 drink a week.  Last much cycle was 2 weeks ago. She has been unable to get pregnant, is getting ready to start in with a new doctor on that. She denies any other major marital or interpersonal or job or financial life stresses. She does admit some stress and anxiety and worry about not being pregnant.    Current allergies, medications, problem list, past/family and social histories reviewed.  Objective:  BP 132/86   Pulse 74   Temp 98.6 F (37 C) (Oral)   Resp 17   Ht  (1.575 m)   Wt 173 lb (78.5 kg)   LMP 06/13/2017 (Approximate)   SpO2 98%   BMI 31.64 kg/m   Pleasant lady, overweight. Alert and oriented. Neck supple without nodes or tenderness. Left arm strength and sensation is normal on exam. No chest wall tenderness. She worries some about the left side of her chest wall being tighter bigger at times, but there is nothing apparently abnormal at this time. Good range of motion of her spine. Chest is clear to auscultation. Heart regular without murmurs  Assessment & Plan:   Assessment: 1. Chest pain, unspecified type       Plan: I think her chest  pains or psychogenic related, and that she is under more stress and depression than she admits to. We talked about that. I decided not to put on medication at this time so she is trying to get pregnant. Urged regular walking and to return if worse. Tried to reassure her.  Orders Placed This Encounter  Procedures  . EKG 12-Lead    No orders of the defined types were placed in this encounter.  EKG normal      Patient Instructions       IF you received an x-ray today, you will receive an invoice from [facility]. Please contact [facility] at [phone] with questions or concerns regarding your invoice.   IF you received labwork today, you will receive an invoice from [facility]. Please contact [facility] at [phone] with questions or concerns regarding your invoice.   Our billing staff will not be able to assist you with questions regarding bills from these companies.  You will be contacted with the lab results as soon as they are available. The fastest way to get your results is to activate your My Chart account. Instructions are located on the last page of this paperwork. If you have not heard from [place] regarding the results in 2 weeks, please contact this office.         No Follow-up on file.  Freddrick Gladson, MD 07/02/2017

## 2017-07-14 ENCOUNTER — Ambulatory Visit (INDEPENDENT_AMBULATORY_CARE_PROVIDER_SITE_OTHER): Payer: BLUE CROSS/BLUE SHIELD | Admitting: Family Medicine

## 2017-07-14 VITALS — BP 116/74 | HR 76 | Temp 98.5°F | Ht 62.0 in | Wt 162.0 lb

## 2017-07-14 DIAGNOSIS — E669 Obesity, unspecified: Secondary | ICD-10-CM | POA: Diagnosis not present

## 2017-07-14 DIAGNOSIS — E559 Vitamin D deficiency, unspecified: Secondary | ICD-10-CM | POA: Diagnosis not present

## 2017-07-14 DIAGNOSIS — Z6830 Body mass index (BMI) 30.0-30.9, adult: Secondary | ICD-10-CM | POA: Diagnosis not present

## 2017-07-14 DIAGNOSIS — R7303 Prediabetes: Secondary | ICD-10-CM

## 2017-07-14 DIAGNOSIS — E7849 Other hyperlipidemia: Secondary | ICD-10-CM

## 2017-07-14 DIAGNOSIS — Z9189 Other specified personal risk factors, not elsewhere classified: Secondary | ICD-10-CM | POA: Diagnosis not present

## 2017-07-14 DIAGNOSIS — K760 Fatty (change of) liver, not elsewhere classified: Secondary | ICD-10-CM | POA: Diagnosis not present

## 2017-07-14 MED ORDER — METFORMIN HCL 500 MG PO TABS: 500.0000 mg | ORAL_TABLET | Freq: Every day | ORAL | 0 refills | Status: DC

## 2017-07-14 MED ORDER — VITAMIN D (ERGOCALCIFEROL) 1.25 MG (50000 UNIT) PO CAPS: 50000.0000 [IU] | ORAL_CAPSULE | ORAL | 0 refills | Status: DC

## 2017-07-14 NOTE — Progress Notes (Signed)
Office: [phone]  /  Fax: [phone]   HPI:   Chief Complaint: OBESITY Stacey Morales is here to discuss her progress with her obesity treatment plan. She is on the Pescatarian eating plan and is following her eating plan approximately 60 % of the time. She states she is walking and exercising at the gym for 30 minutes 2 times per week. Stacey Morales has done very well with weight loss on the Pescatarian plan. Hunger is mostly controlled and she worked to do better with planning meals ahead of time. Her weight is 162 lb (73.5 kg) today and has had a weight loss of 5 pounds over a period of 2 weeks since her last visit. She has lost 5 lbs since starting treatment with [place].  Vitamin D deficiency Stacey Morales has a new diagnosis of vitamin D deficiency. She is not currently taking vit D and admits fatigue but denies nausea, vomiting or muscle weakness.  Non Alcoholic Fatty Liver Disease Stacey Morales has a history of multiple elevated LFTs for years and a abdominal ultrasound due last year confirmed NAFLD. She denies abdominal pain or jaundice.   Hyperlipidemia Stacey Morales has hyperlipidemia and has been trying to improve her cholesterol levels with intensive lifestyle modification including a low saturated fat diet, exercise and weight loss. Her LDL and triglycerides are elevated and she is not currently on a statin. She denies any chest pain, claudication or myalgias.  Pre-Diabetes Stacey Morales has a diagnosis of pre-diabetes based on her elevated Hgb A1c at 6.0 and fasting insulin exceptionally elevated at 132 and family history of diabetes. She was informed this puts her at greater risk of developing diabetes. She is not taking metformin currently and continues to work on diet and exercise to decrease risk of diabetes. She denies nausea or hypoglycemia. Stacey Morales was given extensive diabetes prevention education.  At risk for diabetes Stacey Morales is at higher than average risk for developing diabetes due to her obesity and pre-diabetes.  She currently denies polyuria or polydipsia.   ALLERGIES: No Known Allergies  MEDICATIONS: Current Outpatient Prescriptions on File Prior to Visit  Medication Sig Dispense Refill  . folic acid (FOLVITE) 400 MCG tablet Take 400 mcg by mouth daily.    . Garlic 1000 MG CAPS Take 1 capsule by mouth daily.     No current facility-administered medications on file prior to visit.     PAST MEDICAL HISTORY: Past Medical History:  Diagnosis Date  . Anxiety   . Chest pain   . Constipation   . Depression   . Dyspnea   . GERD (gastroesophageal reflux disease)   . HTN (hypertension)   . Joint pain   . Kidney stone   . Overweight   . Palpitations   . Swallowing difficulty     PAST SURGICAL HISTORY: Past Surgical History:  Procedure Laterality Date  . arm surgery       SOCIAL HISTORY: Social History  Substance Use Topics  . Smoking status: Former Smoker    Years: 14.00    Types: Cigarettes    Quit date: 02/11/2017  . Smokeless tobacco: Never Used  . Alcohol use Yes     Comment: occasionally     FAMILY HISTORY: Family History  Problem Relation Age of Onset  . Obesity Mother   . Diabetes Maternal Aunt   . Diabetes Maternal Uncle   . Diabetes Maternal Grandmother   . Heart attack Maternal Grandmother   . Diabetes Maternal Grandfather     ROS: Review of Systems  Constitutional:  Positive for malaise/fatigue and weight loss.  Cardiovascular: Negative for chest pain and claudication.  Gastrointestinal: Negative for abdominal pain, nausea and vomiting.  Genitourinary: Negative for frequency.  Musculoskeletal: Negative for myalgias.       Negative muscle weakness  Skin:       Negative jaundice  Endo/Heme/Allergies: Negative for polydipsia.       Negative hypoglycemia    PHYSICAL EXAM: Blood pressure 116/74, pulse 76, temperature 98.5 F (36.9 C), temperature source Oral, height  (1.575 m), weight 162 lb (73.5 kg), last menstrual period 06/27/2017, SpO2 98  %. Body mass index is 29.63 kg/m. Physical Exam  Constitutional: She is oriented to person, place, and time. She appears well-developed and well-nourished.  Cardiovascular: Normal rate.   Pulmonary/Chest: Effort normal.  Musculoskeletal: Normal range of motion.  Neurological: She is oriented to person, place, and time.  Skin: Skin is warm and dry.  Psychiatric: She has a normal mood and affect. Her behavior is normal.  Vitals reviewed.   RECENT LABS AND TESTS: BMET    Component Value Date/Time   NA 139 07/01/2017 1125   K 4.3 07/01/2017 1125   CL 102 07/01/2017 1125   CO2 23 07/01/2017 1125   GLUCOSE 110 (H) 07/01/2017 1125   GLUCOSE 137 (H) 09/14/2016 0623   BUN 8 07/01/2017 1125   CREATININE 0.72 07/01/2017 1125   CALCIUM 9.2 07/01/2017 1125   GFRNONAA 114 07/01/2017 1125   GFRAA 131 07/01/2017 1125   Lab Results  Component Value Date   HGBA1C 6.0 (H) 07/01/2017   Lab Results  Component Value Date   INSULIN 132.1 (H) 07/01/2017   CBC    Component Value Date/Time   WBC 8.4 07/01/2017 1125   WBC 10.0 09/14/2016 0623   RBC 4.80 07/01/2017 1125   RBC 5.01 09/14/2016 0623   HGB 14.9 07/01/2017 1125   HCT 44.5 07/01/2017 1125   PLT 252 09/14/2016 0623   MCV 93 07/01/2017 1125   MCH 31.0 07/01/2017 1125   MCH 31.7 09/14/2016 0623   MCHC 33.5 07/01/2017 1125   MCHC 34.4 09/14/2016 0623   RDW 13.0 07/01/2017 1125   LYMPHSABS 2.7 07/01/2017 1125   EOSABS 0.1 07/01/2017 1125   BASOSABS 0.0 07/01/2017 1125   Iron/TIBC/Ferritin/ %Sat No results found for: IRON, TIBC, FERRITIN, IRONPCTSAT Lipid Panel     Component Value Date/Time   CHOL 211 (H) 07/01/2017 1125   TRIG 263 (H) 07/01/2017 1125   HDL 38 (L) 07/01/2017 1125   LDLCALC 120 (H) 07/01/2017 1125   Hepatic Function Panel     Component Value Date/Time   PROT 7.2 07/01/2017 1125   ALBUMIN 4.5 07/01/2017 1125   AST 46 (H) 07/01/2017 1125   ALT 64 (H) 07/01/2017 1125   ALKPHOS 78 07/01/2017 1125    BILITOT 0.4 07/01/2017 1125      Component Value Date/Time   TSH 0.881 07/01/2017 1125    ASSESSMENT AND PLAN: NAFLD (nonalcoholic fatty liver disease)  Other hyperlipidemia  Vitamin D deficiency - Plan: Vitamin D, Ergocalciferol, (DRISDOL) 50000 units CAPS capsule  Prediabetes - Plan: metFORMIN (GLUCOPHAGE) 500 MG tablet  At risk for diabetes mellitus  Class 1 obesity with serious comorbidity and body mass index (BMI) of 30.0 to 30.9 in adult, unspecified obesity type  PLAN:  Vitamin D Deficiency Stacey Morales was informed that low vitamin D levels contributes to fatigue and are associated with obesity, breast, and colon cancer. She agrees to start to take prescription Vit D ,000  IU every week #4 with no refills and will follow up for routine testing of vitamin D, at least 2-3 times per year. She was informed of the risk of over-replacement of vitamin D and agrees to not increase her dose unless he discusses this with [place] first.  Non Alcoholic Fatty Liver Disease We discussed the diagnosis of non alcoholic fatty liver disease today and how this condition is obesity related. Stacey Morales was educated on her risk of developing NASH or even liver failure and the only proven treatment for NAFLD was weight loss. Stacey Morales agreed to continue with her weight loss efforts with healthier diet and exercise as an essential part of her treatment plan. We will recheck labs in 3 months.  Hyperlipidemia Stacey Morales was informed of the American Heart Association Guidelines emphasizing intensive lifestyle modifications as the first line treatment for hyperlipidemia. We discussed many lifestyle modifications today in depth, and Stacey Morales will continue to work on decreasing saturated fats such as fatty red meat, butter and many fried foods. She will also increase vegetables and lean protein in her diet and continue to work on exercise and weight loss efforts. We will recheck labs in 3 months and Stacey Morales will follow up as  directed.  Pre-Diabetes Stacey Morales will continue to work on weight loss, exercise, and decreasing simple carbohydrates in her diet to help decrease the risk of diabetes. We dicussed metformin including benefits and risks. She was informed that eating too many simple carbohydrates or too many calories at one sitting increases the likelihood of GI side effects. Stacey Morales agrees to start metformin for now and a prescription was written today for metformin 500 mg qam #30 with no refills. Stacey Morales agreed to follow up with [place] as directed to monitor her progress.  Diabetes risk counseling Dessie was given extended (30 minutes) diabetes prevention counseling today. She is 31 y.o. female and has risk factors for diabetes including obesity and pre-diabetes. We discussed intensive lifestyle modifications today with an emphasis on weight loss as well as increasing exercise and decreasing simple carbohydrates in her diet.   Obesity Stacey Morales is currently in the action stage of change. As such, her goal is to continue with weight loss efforts She has agreed to follow the Pescatarian eating plan Stacey Morales has been instructed to work up to a goal of 150 minutes of combined cardio and strengthening exercise per week for weight loss and overall health benefits. We discussed the following Behavioral Modification Strategies today: increasing lean protein intake and decreasing simple carbohydrates   Stacey Morales has agreed to follow up with our clinic in 2 weeks. She was informed of the importance of frequent follow up visits to maximize her success with intensive lifestyle modifications for her multiple health conditions.  I, Stacey Morales, am acting as transcriptionist for Stacey Quince, MD  I have reviewed the above documentation for accuracy and completeness, and I agree with the above. -Stacey Quince, MD   OBESITY BEHAVIORAL INTERVENTION VISIT  Today's visit was # 2 out of 22.  Starting weight: 167 lbs Starting date:  07/01/17 Today's weight : 162 lbs Today's date: 07/14/2017 Total lbs lost to date: 5 (Patients must lose 7 lbs in the first 6 months to continue with counseling)   ASK: We discussed the diagnosis of obesity with Stacey Morales today and Stacey Morales agreed to give [place] permission to discuss obesity behavioral modification therapy today.  ASSESS: Stacey Morales has the diagnosis of obesity and her BMI today is 29.62 Stacey Morales is in the action stage of change  ADVISE: Stacey Morales was educated on the multiple health risks of obesity as well as the benefit of weight loss to improve her health. She was advised of the need for long term treatment and the importance of lifestyle modifications.  AGREE: Multiple dietary modification options and treatment options were discussed and  Bionca agreed to follow the Pescatarian eating plan We discussed the following Behavioral Modification Strategies today: increasing lean protein intake and decreasing simple carbohydrates

## 2017-07-15 ENCOUNTER — Ambulatory Visit (INDEPENDENT_AMBULATORY_CARE_PROVIDER_SITE_OTHER): Payer: BLUE CROSS/BLUE SHIELD | Admitting: Family Medicine

## 2017-07-29 ENCOUNTER — Ambulatory Visit (INDEPENDENT_AMBULATORY_CARE_PROVIDER_SITE_OTHER): Payer: BLUE CROSS/BLUE SHIELD | Admitting: Family Medicine

## 2017-07-29 VITALS — BP 110/71 | HR 80 | Temp 99.6°F | Ht 62.0 in | Wt 161.0 lb

## 2017-07-29 DIAGNOSIS — E669 Obesity, unspecified: Secondary | ICD-10-CM

## 2017-07-29 DIAGNOSIS — K5909 Other constipation: Secondary | ICD-10-CM

## 2017-07-29 DIAGNOSIS — Z6830 Body mass index (BMI) 30.0-30.9, adult: Secondary | ICD-10-CM

## 2017-08-02 NOTE — Progress Notes (Signed)
Office: [phone]  /  Fax: [phone]   HPI:   Chief Complaint: OBESITY Stacey Morales is here to discuss her progress with her obesity treatment plan. She is on the Pescatarian eating plan and is following her eating plan approximately 60 % of the time. She states she is exercising 0 minutes 0 times per week. Stacey Morales continues to do well with weight loss but she states she is getting bored with her eating plans especially for dinner.  Her weight is 161 lb (73 kg) today and has had a weight loss of 1 pound over a period of 2 weeks since her last visit. She has lost 6 lbs since starting treatment with [place].  Constipation Stacey Morales notes constipation for the last few weeks, worse since attempting weight loss. She states bowel movements are less frequent and sometimes feels the urge to defecate but cant go. She states they are not hard and painful. She denies hematochezia or melena. She admits to drinking less H20 recently.  ALLERGIES: No Known Allergies  MEDICATIONS: Current Outpatient Prescriptions on File Prior to Visit  Medication Sig Dispense Refill  . folic acid (FOLVITE) 400 MCG tablet Take 400 mcg by mouth daily.    . Garlic 1000 MG CAPS Take 1 capsule by mouth daily.    . metFORMIN (GLUCOPHAGE) 500 MG tablet Take 1 tablet (500 mg total) by mouth daily with breakfast. 30 tablet 0  . Vitamin D, Ergocalciferol, (DRISDOL) 50000 units CAPS capsule Take 1 capsule (50,000 Units total) by mouth every 7 (seven) days. 4 capsule 0   No current facility-administered medications on file prior to visit.     PAST MEDICAL HISTORY: Past Medical History:  Diagnosis Date  . Anxiety   . Chest pain   . Constipation   . Depression   . Dyspnea   . GERD (gastroesophageal reflux disease)   . HTN (hypertension)   . Joint pain   . Kidney stone   . Overweight   . Palpitations   . Swallowing difficulty     PAST SURGICAL HISTORY: Past Surgical History:  Procedure Laterality Date  . arm surgery        SOCIAL HISTORY: Social History  Substance Use Topics  . Smoking status: Former Smoker    Years: 14.00    Types: Cigarettes    Quit date: 02/11/2017  . Smokeless tobacco: Never Used  . Alcohol use Yes     Comment: occasionally     FAMILY HISTORY: Family History  Problem Relation Age of Onset  . Obesity Mother   . Diabetes Maternal Aunt   . Diabetes Maternal Uncle   . Diabetes Maternal Grandmother   . Heart attack Maternal Grandmother   . Diabetes Maternal Grandfather     ROS: Review of Systems  Constitutional: Positive for weight loss.  Gastrointestinal: Positive for constipation. Negative for melena.       Negative hematochezia    PHYSICAL EXAM: Blood pressure 110/71, pulse 80, temperature 99.6 F (37.6 C), temperature source Oral, height 5\' 2"  (1.575 m), weight 161 lb (73 kg), last menstrual period 06/30/2017, SpO2 97 %. Body mass index is 29.45 kg/m. Physical Exam  Constitutional: She is oriented to person, place, and time. She appears well-developed and well-nourished.  Cardiovascular: Normal rate.   Pulmonary/Chest: Effort normal.  Musculoskeletal: Normal range of motion.  Neurological: She is oriented to person, place, and time.  Skin: Skin is warm and dry.  Psychiatric: She has a normal mood and affect. Her behavior is normal.  Vitals reviewed.   RECENT LABS AND TESTS: BMET    Component Value Date/Time   NA 139 07/01/2017 1125   K 4.3 07/01/2017 1125   CL 102 07/01/2017 1125   CO2 23 07/01/2017 1125   GLUCOSE 110 (H) 07/01/2017 1125   GLUCOSE 137 (H) 09/14/2016 0623   BUN 8 07/01/2017 1125   CREATININE 0.72 07/01/2017 1125   CALCIUM 9.2 07/01/2017 1125   GFRNONAA 114 07/01/2017 1125   GFRAA 131 07/01/2017 1125   Lab Results  Component Value Date   HGBA1C 6.0 (H) 07/01/2017   Lab Results  Component Value Date   INSULIN 132.1 (H) 07/01/2017   CBC    Component Value Date/Time   WBC 8.4 07/01/2017 1125   WBC 10.0 09/14/2016 0623   RBC  4.80 07/01/2017 1125   RBC 5.01 09/14/2016 0623   HGB 14.9 07/01/2017 1125   HCT 44.5 07/01/2017 1125   PLT 252 09/14/2016 0623   MCV 93 07/01/2017 1125   MCH 31.0 07/01/2017 1125   MCH 31.7 09/14/2016 0623   MCHC 33.5 07/01/2017 1125   MCHC 34.4 09/14/2016 0623   RDW 13.0 07/01/2017 1125   LYMPHSABS 2.7 07/01/2017 1125   EOSABS 0.1 07/01/2017 1125   BASOSABS 0.0 07/01/2017 1125   Iron/TIBC/Ferritin/ %Sat No results found for: IRON, TIBC, FERRITIN, IRONPCTSAT Lipid Panel     Component Value Date/Time   CHOL 211 (H) 07/01/2017 1125   TRIG 263 (H) 07/01/2017 1125   HDL 38 (L) 07/01/2017 1125   LDLCALC 120 (H) 07/01/2017 1125   Hepatic Function Panel     Component Value Date/Time   PROT 7.2 07/01/2017 1125   ALBUMIN 4.5 07/01/2017 1125   AST 46 (H) 07/01/2017 1125   ALT 64 (H) 07/01/2017 1125   ALKPHOS 78 07/01/2017 1125   BILITOT 0.4 07/01/2017 1125      Component Value Date/Time   TSH 0.881 07/01/2017 1125    ASSESSMENT AND PLAN: Other constipation  Class 1 obesity with serious comorbidity and body mass index (BMI) of 30.0 to 30.9 in adult, unspecified obesity type - Starting BMI greater then 30  PLAN:  Constipation Stacey Morales was informed decrease bowel movement frequency is normal while losing weight, but stools should not be hard or painful. She was advised to increase her H20 intake and work on increasing her fiber intake. She agrees to start OTC benefiber in the PM. High fiber foods were discussed today. Stacey Morales agrees to follow up with our clinic in 2 weeks.  Obesity Stacey Morales is currently in the action stage of change. As such, her goal is to continue with weight loss efforts She has agreed to keep a food journal with 350-450 calories and 30 grams of protein at supper daily and follow the Pescatarian eating plan and recipes were given. Stacey Morales has been instructed to work up to a goal of 150 minutes of combined cardio and strengthening exercise per week for weight loss  and overall health benefits. We discussed the following Behavioral Modification Strategies today: increasing lean protein intake and decreasing simple carbohydrates    Stacey Morales has agreed to follow up with our clinic in 2 weeks. She was informed of the importance of frequent follow up visits to maximize her success with intensive lifestyle modifications for her multiple health conditions.  I, Burt Knack, am acting as transcriptionist for Quillian Quince, MD  I have reviewed the above documentation for accuracy and completeness, and I agree with the above. -Quillian Quince, MD  Today's visit was # 3 out of 22.  Starting weight: 167 lbs Starting date: 07/01/17 Today's weight : 161 lbs  Today's date: 07/29/2017 Total lbs lost to date: 6 (Patients must lose 7 lbs in the first 6 months to continue with counseling)   ASK: We discussed the diagnosis of obesity with Stacey Morales today and Stacey Morales agreed to give us permission to discuss obesity behavioral modification therapy today.  ASSESS: Stacey Morales has the diagnosis of obesity and her BMI today is 2629 Stacey Morales is in the action stage of change   ADVISE: Stacey Morales was educated on the multiple health risks of obesity as well as the benefit of weight loss to improve her health. She was advised of the need for long term treatment and the importance of lifestyle modifications.  AGREE: Multiple dietary modification options and treatment options were discussed and  Stacey Morales agreed to keep a food journal with 350-450 calories and 30 grams of protein at supper daily and follow the Pescatarian eating plan and recipes were given. We discussed the following Behavioral Modification Strategies today: increasing lean protein intake and decreasing simple carbohydrates

## 2017-08-11 ENCOUNTER — Ambulatory Visit (INDEPENDENT_AMBULATORY_CARE_PROVIDER_SITE_OTHER): Payer: BLUE CROSS/BLUE SHIELD | Admitting: Physician Assistant

## 2017-08-11 VITALS — BP 116/68 | HR 69 | Temp 98.7°F | Ht 62.0 in | Wt 159.0 lb

## 2017-08-11 DIAGNOSIS — Z6830 Body mass index (BMI) 30.0-30.9, adult: Secondary | ICD-10-CM | POA: Diagnosis not present

## 2017-08-11 DIAGNOSIS — R079 Chest pain, unspecified: Secondary | ICD-10-CM

## 2017-08-11 DIAGNOSIS — E669 Obesity, unspecified: Secondary | ICD-10-CM

## 2017-08-11 DIAGNOSIS — R7303 Prediabetes: Secondary | ICD-10-CM | POA: Diagnosis not present

## 2017-08-11 DIAGNOSIS — E559 Vitamin D deficiency, unspecified: Secondary | ICD-10-CM | POA: Diagnosis not present

## 2017-08-11 MED ORDER — VITAMIN D (ERGOCALCIFEROL) 1.25 MG (50000 UNIT) PO CAPS: 50000.0000 [IU] | ORAL_CAPSULE | ORAL | 0 refills | Status: DC

## 2017-08-11 MED ORDER — METFORMIN HCL 500 MG PO TABS: 500.0000 mg | ORAL_TABLET | Freq: Every day | ORAL | 0 refills | Status: DC

## 2017-08-11 NOTE — Progress Notes (Signed)
Office: [phone]  /  Fax: [phone]   HPI:   Chief Complaint: OBESITY Stacey Morales is here to discuss her progress with her obesity treatment plan. She is on the Pescatarian eating plan and is following her eating plan approximately 50 % of the time. She states she is walking and doing strengthening exercises for 60 minutes 1 1/2 times per week. Stacey Morales continues to do well with weight loss. She states she has been hungry but states she is not eating all the food in the meal plan.  Stacey Morales would like more meal planning ideas. Her weight is 159 lb (72.1 kg) today and has had a weight loss of 2 pounds over a period of 2 weeks since her last visit. She has lost 8 lbs since starting treatment with [place].  Vitamin D deficiency Stacey Morales has a diagnosis of vitamin D deficiency. She is currently taking vit D and denies nausea, vomiting or muscle weakness.  Chest pain Patient admits to having an episode of chest pain associated with palpitations on one occasion last week. Pain was described as heaviness that lasted for afew minutes. Denies recurrence of the pain, denies any alleviating/exacerbating factors. Denies nausea, vomiting, diaphoresis, or any radiation of the pain. States she has been drinking more caffeine recently. Also states she has been more stressed and worried as she has not been able to get pregnant. Has had workup of chest pain with EKG with Dr. Alwyn Ren, her PCP, and according to record review, chest pain was attributed to anxiety and stress.     Pre-Diabetes Stacey Morales has a diagnosis of pre-diabetes based on her elevated Hgb A1c and was informed this puts her at greater risk of developing diabetes. She is taking metformin currently and continues to work on diet and exercise to decrease risk of diabetes. She admits polyphagia and denies nausea or hypoglycemia.   ALLERGIES: No Known Allergies  MEDICATIONS: Current Outpatient Prescriptions on File Prior to Visit  Medication Sig Dispense Refill   . folic acid (FOLVITE) 400 MCG tablet Take 400 mcg by mouth daily.    . Garlic 1000 MG CAPS Take 1 capsule by mouth daily.     No current facility-administered medications on file prior to visit.     PAST MEDICAL HISTORY: Past Medical History:  Diagnosis Date  . Anxiety   . Chest pain   . Constipation   . Depression   . Dyspnea   . GERD (gastroesophageal reflux disease)   . HTN (hypertension)   . Joint pain   . Kidney stone   . Overweight   . Palpitations   . Swallowing difficulty     PAST SURGICAL HISTORY: Past Surgical History:  Procedure Laterality Date  . arm surgery       SOCIAL HISTORY: Social History  Substance Use Topics  . Smoking status: Former Smoker    Years: 14.00    Types: Cigarettes    Quit date: 02/11/2017  . Smokeless tobacco: Never Used  . Alcohol use Yes     Comment: occasionally     FAMILY HISTORY: Family History  Problem Relation Age of Onset  . Obesity Mother   . Diabetes Maternal Aunt   . Diabetes Maternal Uncle   . Diabetes Maternal Grandmother   . Heart attack Maternal Grandmother   . Diabetes Maternal Grandfather     ROS: Review of Systems  Constitutional: Positive for weight loss.  Gastrointestinal: Negative for nausea and vomiting.  Musculoskeletal:       Negative muscle weakness  Endo/Heme/Allergies:       Positive polyphagia Negative hypoglycemia    PHYSICAL EXAM: Blood pressure 116/68, pulse 69, temperature 98.7 F (37.1 C), temperature source Oral, height 5\' 2"  (1.575 m), weight 159 lb (72.1 kg), SpO2 98 %. Body mass index is 29.08 kg/m. Physical Exam  Constitutional: She is oriented to person, place, and time. She appears well-developed and well-nourished.  Cardiovascular: Normal rate.   Pulmonary/Chest: Effort normal.  Musculoskeletal: Normal range of motion.  Neurological: She is oriented to person, place, and time.  Skin: Skin is warm and dry.  Psychiatric: She has a normal mood and affect. Her behavior is  normal.  Vitals reviewed.   RECENT LABS AND TESTS: BMET    Component Value Date/Time   NA 139 07/01/2017 1125   K 4.3 07/01/2017 1125   CL 102 07/01/2017 1125   CO2 23 07/01/2017 1125   GLUCOSE 110 (H) 07/01/2017 1125   GLUCOSE 137 (H) 09/14/2016 0623   BUN 8 07/01/2017 1125   CREATININE 0.72 07/01/2017 1125   CALCIUM 9.2 07/01/2017 1125   GFRNONAA 114 07/01/2017 1125   GFRAA 131 07/01/2017 1125   Lab Results  Component Value Date   HGBA1C 6.0 (H) 07/01/2017   Lab Results  Component Value Date   INSULIN 132.1 (H) 07/01/2017   CBC    Component Value Date/Time   WBC 8.4 07/01/2017 1125   WBC 10.0 09/14/2016 0623   RBC 4.80 07/01/2017 1125   RBC 5.01 09/14/2016 0623   HGB 14.9 07/01/2017 1125   HCT 44.5 07/01/2017 1125   PLT 252 09/14/2016 0623   MCV 93 07/01/2017 1125   MCH 31.0 07/01/2017 1125   MCH 31.7 09/14/2016 0623   MCHC 33.5 07/01/2017 1125   MCHC 34.4 09/14/2016 0623   RDW 13.0 07/01/2017 1125   LYMPHSABS 2.7 07/01/2017 1125   EOSABS 0.1 07/01/2017 1125   BASOSABS 0.0 07/01/2017 1125   Iron/TIBC/Ferritin/ %Sat No results found for: IRON, TIBC, FERRITIN, IRONPCTSAT Lipid Panel     Component Value Date/Time   CHOL 211 (H) 07/01/2017 1125   TRIG 263 (H) 07/01/2017 1125   HDL 38 (L) 07/01/2017 1125   LDLCALC 120 (H) 07/01/2017 1125   Hepatic Function Panel     Component Value Date/Time   PROT 7.2 07/01/2017 1125   ALBUMIN 4.5 07/01/2017 1125   AST 46 (H) 07/01/2017 1125   ALT 64 (H) 07/01/2017 1125   ALKPHOS 78 07/01/2017 1125   BILITOT 0.4 07/01/2017 1125      Component Value Date/Time   TSH 0.881 07/01/2017 1125    ASSESSMENT AND PLAN: Prediabetes - Plan: metFORMIN (GLUCOPHAGE) 500 MG tablet  Vitamin D deficiency - Plan: Vitamin D, Ergocalciferol, (DRISDOL) 50000 units CAPS capsule  Chest pain, unspecified type  Class 1 obesity with serious comorbidity and body mass index (BMI) of 30.0 to 30.9 in adult, unspecified obesity type -  Starting BMI greater then 30  PLAN:  Vitamin D Deficiency Stacey Morales was informed that low vitamin D levels contributes to fatigue and are associated with obesity, breast, and colon cancer. She agrees to continue to take prescription Vit D @50 ,000 IU every week #4 with no refills and will follow up for routine testing of vitamin D, at least 2-3 times per year. She was informed of the risk of over-replacement of vitamin D and agrees to not increase her dose unless he discusses this with [place] first. Jaysie agrees to follow up with our clinic in 2 weeks.  Pre-Diabetes Stacey Morales  will continue to work on weight loss, exercise, and decreasing simple carbohydrates in her diet to help decrease the risk of diabetes. We dicussed metformin including benefits and risks. She was informed that eating too many simple carbohydrates or too many calories at one sitting increases the likelihood of GI side effects. Stacey Morales agrees to continue metformin for now and a prescription was written today for 1 month refill. Stacey Morales agreed to follow up with us as directed to monitor her progress.  Chest pain Due to normal EKG, and presentation of chest pain, this likely related to your anxiety and stress. However, further workup might be needed. Patient is offered cardiologist referral, but declines today. She is encouraged to limit her caffeine intake, and work on [employer] Corporationhealthier diet and regular exercise. She is instructed that if she has any recurrence of the chest pain, or any other symptoms associated with the chest pain, to go to the nearest ER or call 911.  Obesity Stacey Morales is currently in the action stage of change. As such, her goal is to continue with weight loss efforts She has agreed to follow the Category 2 plan Stacey Morales has been instructed to work up to a goal of 150 minutes of combined cardio and strengthening exercise per week for weight loss and overall health benefits. We discussed the following Behavioral Modification Strategies today:  increasing lean protein intake and work on meal planning and easy cooking plans  Stacey Morales has agreed to follow up with our clinic in 2 weeks. She was informed of the importance of frequent follow up visits to maximize her success with intensive lifestyle modifications for her multiple health conditions.  I, [place] CraneJoanne Murray, am acting as transcriptionist for Illa LevelSahar Osman, PA-C  I have reviewed the above documentation for accuracy and completeness, and I agree with the above. -Illa LevelSahar Osman, PA-C  I have reviewed the above note and agree with the plan. -Quillian Quincearen Beasley, MD  OBESITY BEHAVIORAL INTERVENTION VISIT  Today's visit was # 4 out of 22.  Starting weight: 167 lbs Starting date: 07/01/17 Today's weight : 159 lbs  Today's date: 08/12/2017 Total lbs lost to date: 8 (Patients must lose 7 lbs in the first 6 months to continue with counseling)   ASK: We discussed the diagnosis of obesity with Stacey Morales Morales today and Stacey Morales agreed to give us permission to discuss obesity behavioral modification therapy today.  ASSESS: Stacey Morales has the diagnosis of obesity and her BMI today is 29.07 Stacey Morales is in the action stage of change   ADVISE: Stacey Morales was educated on the multiple health risks of obesity as well as the benefit of weight loss to improve her health. She was advised of the need for long term treatment and the importance of lifestyle modifications.  AGREE: Multiple dietary modification options and treatment options were discussed and  Stacey Morales agreed to follow the Category 2 plan We discussed the following Behavioral Modification Strategies today: increasing lean protein intake and work on meal planning and easy cooking plans

## 2017-08-23 DIAGNOSIS — Z31430 Encounter of female for testing for genetic disease carrier status for procreative management: Secondary | ICD-10-CM | POA: Diagnosis not present

## 2017-08-23 DIAGNOSIS — E282 Polycystic ovarian syndrome: Secondary | ICD-10-CM | POA: Diagnosis not present

## 2017-08-23 DIAGNOSIS — Z3161 Procreative counseling and advice using natural family planning: Secondary | ICD-10-CM | POA: Diagnosis not present

## 2017-08-30 ENCOUNTER — Ambulatory Visit (INDEPENDENT_AMBULATORY_CARE_PROVIDER_SITE_OTHER): Payer: BLUE CROSS/BLUE SHIELD | Admitting: Physician Assistant

## 2017-09-07 ENCOUNTER — Encounter: Payer: Self-pay | Admitting: Family Medicine

## 2017-09-07 ENCOUNTER — Other Ambulatory Visit: Payer: Self-pay

## 2017-09-07 ENCOUNTER — Ambulatory Visit: Payer: BLUE CROSS/BLUE SHIELD | Admitting: Family Medicine

## 2017-09-07 VITALS — BP 118/70 | HR 72 | Temp 99.8°F | Wt 166.2 lb

## 2017-09-07 DIAGNOSIS — E559 Vitamin D deficiency, unspecified: Secondary | ICD-10-CM | POA: Diagnosis not present

## 2017-09-07 DIAGNOSIS — N979 Female infertility, unspecified: Secondary | ICD-10-CM

## 2017-09-07 DIAGNOSIS — E88819 Insulin resistance, unspecified: Secondary | ICD-10-CM

## 2017-09-07 DIAGNOSIS — E282 Polycystic ovarian syndrome: Secondary | ICD-10-CM

## 2017-09-07 DIAGNOSIS — R51 Headache: Secondary | ICD-10-CM | POA: Diagnosis not present

## 2017-09-07 DIAGNOSIS — E8881 Metabolic syndrome: Secondary | ICD-10-CM

## 2017-09-07 DIAGNOSIS — Z23 Encounter for immunization: Secondary | ICD-10-CM | POA: Diagnosis not present

## 2017-09-07 DIAGNOSIS — G4730 Sleep apnea, unspecified: Secondary | ICD-10-CM | POA: Diagnosis not present

## 2017-09-07 DIAGNOSIS — R519 Headache, unspecified: Secondary | ICD-10-CM

## 2017-09-07 MED ORDER — PROMETHAZINE HCL 12.5 MG PO TABS: 12.5000 mg | ORAL_TABLET | Freq: Three times a day (TID) | ORAL | 0 refills | Status: DC | PRN

## 2017-09-07 NOTE — Patient Instructions (Signed)
There are not really medications that are safe to take for migraines during pregnancy. TRY:    See if your insurance will pay for a massage and I will happily write a prescription. If you have a migraine, it is okay to take Tylenol with Phenergan (called in today). This should help you sleep.  Work on your posture and limit night-time electronics time.

## 2017-09-07 NOTE — Progress Notes (Addendum)
Jacqulynn CadetLaura Kuenzel is a 31 y.o. female [facility]   Patient Care Team: Helane RimaWallace, Kristianne Albin, DO as PCP - General (Family Medicine)   History of Present Illness:   HPI: See Assessment and Plan section for Problem Based Charting of issues discussed today.  Health Maintenance Due  Topic Date Due  . TETANUS/TDAP  03/20/2007   Depression screen PHQ 2/9 07/02/2017  Decreased Interest 2  Down, Depressed, Hopeless 2  PHQ - 2 Score 4  Altered sleeping 0  Tired, decreased energy 2  Change in appetite 2  Feeling bad or failure about yourself  0  Trouble concentrating 0  Moving slowly or fidgety/restless 0  Suicidal thoughts 0  PHQ-9 Score 8  Difficult doing work/chores Somewhat difficult   PMHx, SurgHx, SocialHx, Medications, and Allergies were reviewed in the Visit Navigator and updated as appropriate.   Past Medical History:  Diagnosis Date  . GERD (gastroesophageal reflux disease)   . Kidney stone     Past Surgical History:  Procedure Laterality Date  . ARM SURGERY      Family History  Problem Relation Age of Onset  . Obesity Mother   . Diabetes Maternal Aunt   . Diabetes Maternal Uncle   . Diabetes Maternal Grandmother   . Heart attack Maternal Grandmother   . Diabetes Maternal Grandfather    Social History   Tobacco Use  . Smoking status: Former Smoker    Years: 14.00    Types: Cigarettes    Last attempt to quit: 02/11/2017    Years since quitting: 0.5  . Smokeless tobacco: Never Used  Substance Use Topics  . Alcohol use: Yes    Comment: occasionally   . Drug use: No   Current Medications and Allergies:    .  folic acid (FOLVITE) 400 MCG tablet, Take 400 mcg by mouth daily., Disp: , Rfl:  .  Garlic 1000 MG CAPS, Take 1 capsule by mouth daily., Disp: , Rfl:  .  metFORMIN (GLUCOPHAGE) 500 MG tablet, Take 1 tablet (500 mg total) by mouth daily with breakfast., Disp: 30 tablet, Rfl: 0 .  Vitamin D, Ergocalciferol, (DRISDOL) 50000 units CAPS capsule, Take  1 capsule (50,000 Units total) by mouth every 7 (seven) days., Disp: 4 capsule, Rfl: 0  No Known Allergies   Review of Systems:   Pertinent items are noted in the HPI. Otherwise, ROS is negative.  Vitals:   Vitals:   09/07/17 1504  BP: 118/70  Pulse: 72  Temp: 99.8 F (37.7 C)  TempSrc: Oral  SpO2: 97%  Weight: 166 lb 3.2 oz (75.4 kg)     Body mass index is 30.4 kg/m.   Physical Exam:   Physical Exam  Constitutional: She is oriented to person, place, and time. She appears well-developed and well-nourished. No distress.  HENT:  Head: Normocephalic and atraumatic.  Right Ear: External ear normal.  Left Ear: External ear normal.  Nose: Nose normal.  Mouth/Throat: Oropharynx is clear and moist.  Eyes: Conjunctivae and EOM are normal. Pupils are equal, round, and reactive to light.  Neck: Normal range of motion. Neck supple. No thyromegaly present.  Cardiovascular: Normal rate, regular rhythm, normal heart sounds and intact distal pulses.  Pulmonary/Chest: Effort normal and breath sounds normal.  Abdominal: Soft. Bowel sounds are normal.  Musculoskeletal: Normal range of motion.  Lymphadenopathy:    She has no cervical adenopathy.  Neurological: She is alert and oriented to person, place, and time.  Skin: Skin is warm  and dry. Capillary refill takes less than 2 seconds.  Psychiatric: She has a normal mood and affect. Her behavior is normal.  Nursing note and vitals reviewed.  Results for orders placed or performed in visit on 07/01/17  Vitamin B12  Result Value Ref Range   Vitamin B-12 332 232 - 1,245 pg/mL  CBC With Differential  Result Value Ref Range   WBC 8.4 3.4 - 10.8 x10E3/uL   RBC 4.80 3.77 - 5.28 x10E6/uL   Hemoglobin 14.9 11.1 - 15.9 g/dL   Hematocrit 16.1 09.6 - 46.6 %   MCV 93 79 - 97 fL   MCH 31.0 26.6 - 33.0 pg   MCHC 33.5 31.5 - 35.7 g/dL   RDW 04.5 40.9 - 81.1 %   Neutrophils 62 Not Estab. %   Lymphs 32 Not Estab. %   Monocytes 5 Not Estab. %     Eos 1 Not Estab. %   Basos 0 Not Estab. %   Neutrophils Absolute 5.2 1.4 - 7.0 x10E3/uL   Lymphocytes Absolute 2.7 0.7 - 3.1 x10E3/uL   Monocytes Absolute 0.4 0.1 - 0.9 x10E3/uL   EOS (ABSOLUTE) 0.1 0.0 - 0.4 x10E3/uL   Basophils Absolute 0.0 0.0 - 0.2 x10E3/uL   Immature Granulocytes 0 Not Estab. %   Immature Grans (Abs) 0.0 0.0 - 0.1 x10E3/uL  Folate  Result Value Ref Range   Folate >20.0 >3.0 ng/mL  Insulin, random  Result Value Ref Range   INSULIN 132.1 (H) 2.6 - 24.9 uIU/mL  Comprehensive metabolic panel  Result Value Ref Range   Glucose 110 (H) 65 - 99 mg/dL   BUN 8 6 - 20 mg/dL   Creatinine, Ser 9.14 0.57 - 1.00 mg/dL   GFR calc non Af Amer 114 >59 mL/min/1.73   GFR calc Af Amer 131 >59 mL/min/1.73   BUN/Creatinine Ratio 11 9 - 23   Sodium 139 134 - 144 mmol/L   Potassium 4.3 3.5 - 5.2 mmol/L   Chloride 102 96 - 106 mmol/L   CO2 23 20 - 29 mmol/L   Calcium 9.2 8.7 - 10.2 mg/dL   Total Protein 7.2 6.0 - 8.5 g/dL   Albumin 4.5 3.5 - 5.5 g/dL   Globulin, Total 2.7 1.5 - 4.5 g/dL   Albumin/Globulin Ratio 1.7 1.2 - 2.2   Bilirubin Total 0.4 0.0 - 1.2 mg/dL   Alkaline Phosphatase 78 39 - 117 IU/L   AST 46 (H) 0 - 40 IU/L   ALT 64 (H) 0 - 32 IU/L  Lipid Panel With LDL/HDL Ratio  Result Value Ref Range   Cholesterol, Total 211 (H) 100 - 199 mg/dL   Triglycerides 782 (H) 0 - 149 mg/dL   HDL 38 (L) >95 mg/dL   VLDL Cholesterol Cal 53 (H) 5 - 40 mg/dL   LDL Calculated 621 (H) 0 - 99 mg/dL   LDl/HDL Ratio 3.2 0.0 - 3.2 ratio  T3  Result Value Ref Range   T3, Total 136 71 - 180 ng/dL  T4, free  Result Value Ref Range   Free T4 1.16 0.82 - 1.77 ng/dL  TSH  Result Value Ref Range   TSH 0.881 0.450 - 4.500 uIU/mL  VITAMIN D 25 Hydroxy (Vit-D Deficiency, Fractures)  Result Value Ref Range   Vit D, 25-Hydroxy 22.5 (L) 30.0 - 100.0 ng/mL  Hemoglobin A1c  Result Value Ref Range   Hgb A1c MFr Bld 6.0 (H) 4.8 - 5.6 %   Est. average glucose Bld gHb Est-mCnc 126 mg/dL  Assessment and Plan:   Vernona RiegerLaura was seen today for establish care.  Diagnoses and all orders for this visit:  Nonintractable episodic headache, unspecified headache type Comments: Intermittent.  Sometimes one-sided with associated photophobia and phonophobia.  She often has nausea associated with this as well.  She does describe some episodes of tension-like headaches with a bandlike feeling around her temples.  She works in a [employer] for most of the day.  Eyes were checked in the last year with no update to her glasses.  She denies noticing grinding or clenching her teeth.  She has never taken anything chronically for headaches.  Unfortunately, in terms of medication treatment, she is hopeful to become pregnant within the next 1-2 months.  I explained to her that there are no medications to help for migraine prophylaxis that would be acceptable during pregnancy.  I do recommend a daily supplement that is safe.  If she develops a migraine, I have instructed her to take Tylenol and Phenergan.  She is then to try to go to sleep with a cold compress on her forehead.  She will let us know if this is not helpful.  We discussed monitoring for triggers as well. Orders: -     promethazine (PHENERGAN) 12.5 MG tablet; Take 1 tablet (12.5 mg total) by mouth every 8 (eight) hours as needed for nausea or vomiting.  Sleep disorder breathing Comments: Since the patient is having issues with PCO S and metabolic syndrome, it is important to consider a home sleep study to evaluate for sleep disordered breathing.  She does endorse snoring.  She also has a history of episodic headaches and notices them sometimes in the morning. Orders: -     Home sleep test; Future  Need for immunization against influenza -     Flu Vaccine QUAD 36+ mos IM  Vitamin D deficiency, supplemented.   Female infertility Comments: See PCO S below.  PCOS (polycystic ovarian syndrome) Comments: Followed by GYN and now infertility  specialty.  She and her husband are hopeful to become pregnant within the next few months.  Insulin resistance Comments: Labs above.  She is now on metformin at a therapeutic dose.  We reviewed the importance of diet and exercise for control this medical issue.   . Reviewed expectations re: course of current medical issues. . Discussed self-management of symptoms. . Outlined signs and symptoms indicating need for more acute intervention. . Patient verbalized understanding and all questions were answered. Marland Kitchen. Health Maintenance issues including appropriate healthy diet, exercise, and smoking avoidance were discussed with patient. . See orders for this visit as documented in the electronic medical record. . Patient received an After Visit Summary.   Helane RimaErica Ziyan Hillmer, DO [place] 09/12/2017  Future Appointments  Date Time Provider Department Center  09/14/2017  4:30 PM Gayleen Oremsman, Sahar M, PA-C [facility] None

## 2017-09-10 ENCOUNTER — Telehealth: Payer: Self-pay | Admitting: Family Medicine

## 2017-09-10 NOTE — Telephone Encounter (Signed)
[facility] will [facility] to schedule after insurance authorization.

## 2017-09-12 ENCOUNTER — Encounter: Payer: Self-pay | Admitting: Family Medicine

## 2017-09-12 DIAGNOSIS — E282 Polycystic ovarian syndrome: Secondary | ICD-10-CM | POA: Insufficient documentation

## 2017-09-12 DIAGNOSIS — N979 Female infertility, unspecified: Secondary | ICD-10-CM | POA: Insufficient documentation

## 2017-09-12 DIAGNOSIS — E559 Vitamin D deficiency, unspecified: Secondary | ICD-10-CM | POA: Insufficient documentation

## 2017-09-13 ENCOUNTER — Ambulatory Visit (INDEPENDENT_AMBULATORY_CARE_PROVIDER_SITE_OTHER): Payer: BLUE CROSS/BLUE SHIELD | Admitting: Physician Assistant

## 2017-09-14 ENCOUNTER — Ambulatory Visit (INDEPENDENT_AMBULATORY_CARE_PROVIDER_SITE_OTHER): Payer: BLUE CROSS/BLUE SHIELD | Admitting: Physician Assistant

## 2017-09-14 VITALS — BP 117/74 | HR 69 | Temp 98.2°F | Ht 62.0 in | Wt 160.0 lb

## 2017-09-14 DIAGNOSIS — E669 Obesity, unspecified: Secondary | ICD-10-CM | POA: Diagnosis not present

## 2017-09-14 DIAGNOSIS — Z6830 Body mass index (BMI) 30.0-30.9, adult: Secondary | ICD-10-CM | POA: Diagnosis not present

## 2017-09-14 DIAGNOSIS — R7303 Prediabetes: Secondary | ICD-10-CM | POA: Diagnosis not present

## 2017-09-14 DIAGNOSIS — E559 Vitamin D deficiency, unspecified: Secondary | ICD-10-CM

## 2017-09-14 MED ORDER — VITAMIN D (ERGOCALCIFEROL) 1.25 MG (50000 UNIT) PO CAPS: 50000.0000 [IU] | ORAL_CAPSULE | ORAL | 0 refills | Status: DC

## 2017-09-14 NOTE — Progress Notes (Addendum)
Office: [phone]  /  Fax: [phone]   HPI:   Chief Complaint: OBESITY Stacey Morales is here to discuss her progress with her obesity treatment plan. She is on the Category 2 plan and is following her eating plan approximately 40 % of the time. She states she is exercising 0 minutes 0 times per week. Stacey Morales  Has not been following the plan, and has been skipping breakfast, and has not been getting the recommended protein on the meal plan. She states has noticed increased hunger in the evening. She would like more variety with her meals. Her weight is 160 lb (72.6 kg) today and has had a weight gain of 1 pound over a period of 5 weeks since her last visit. She has lost 7 lbs since starting treatment with us.  Vitamin D deficiency Stacey Morales has a diagnosis of vitamin D deficiency. She is currently taking vit D and denies nausea, vomiting or muscle weakness.  Pre-Diabetes Stacey Morales has a diagnosis of pre-diabetes based on her elevated Hgb A1c and was informed this puts her at greater risk of developing diabetes. She is taking metformin currently and continues to work on diet and exercise to decrease risk of diabetes. She admits polyphagia and denies nausea or hypoglycemia.  ALLERGIES: No Known Allergies  MEDICATIONS: Current Outpatient Medications on File Prior to Visit  Medication Sig Dispense Refill  . folic acid (FOLVITE) 400 MCG tablet Take 400 mcg by mouth daily.    . Garlic 1000 MG CAPS Take 1 capsule by mouth daily.    Marland Kitchen. letrozole (FEMARA) 2.5 MG tablet Take 2.5 mg by mouth daily. Take 3 pills once a day starting the 3 day of menstral cycle for 5 days    . medroxyPROGESTERone (PROVERA) 10 MG tablet Take 10 mg by mouth daily. Take as directed for 10 days    . metFORMIN (GLUCOPHAGE) 500 MG tablet Take 1 tablet (500 mg total) by mouth daily with breakfast. (Patient taking differently: Take 1,000 mg by mouth 2 (two) times daily with a meal. ) 30 tablet 0  . promethazine (PHENERGAN) 12.5 MG tablet  Take 1 tablet (12.5 mg total) by mouth every 8 (eight) hours as needed for nausea or vomiting. 20 tablet 0   No current facility-administered medications on file prior to visit.     PAST MEDICAL HISTORY: Past Medical History:  Diagnosis Date  . GERD (gastroesophageal reflux disease)   . Kidney stone     PAST SURGICAL HISTORY: Past Surgical History:  Procedure Laterality Date  . ARM SURGERY      SOCIAL HISTORY: Social History   Tobacco Use  . Smoking status: Former Smoker    Years: 14.00    Types: Cigarettes    Last attempt to quit: 02/11/2017    Years since quitting: 0.5  . Smokeless tobacco: Never Used  Substance Use Topics  . Alcohol use: Yes    Comment: occasionally   . Drug use: No    FAMILY HISTORY: Family History  Problem Relation Age of Onset  . Obesity Mother   . Diabetes Maternal Aunt   . Diabetes Maternal Uncle   . Diabetes Maternal Grandmother   . Heart attack Maternal Grandmother   . Diabetes Maternal Grandfather     ROS: Review of Systems  Constitutional: Negative for weight loss.  Gastrointestinal: Negative for nausea and vomiting.  Musculoskeletal:       Negative muscle weakness  Endo/Heme/Allergies:       Positive polyphagia Negative hypoglycemia  PHYSICAL EXAM: Blood pressure 117/74, pulse 69, temperature 98.2 F (36.8 C), temperature source Oral, height 5\' 2"  (1.575 m), weight 160 lb (72.6 kg), last menstrual period 09/08/2017, SpO2 98 %. Body mass index is 29.26 kg/m. Physical Exam  Constitutional: She is oriented to person, place, and time. She appears well-developed and well-nourished.  Cardiovascular: Normal rate.  Pulmonary/Chest: Effort normal.  Musculoskeletal: Normal range of motion.  Neurological: She is oriented to person, place, and time.  Skin: Skin is warm and dry.  Psychiatric: She has a normal mood and affect. Her behavior is normal.  Vitals reviewed.   RECENT LABS AND TESTS: BMET    Component Value Date/Time     NA 139 07/01/2017 1125   K 4.3 07/01/2017 1125   CL 102 07/01/2017 1125   CO2 23 07/01/2017 1125   GLUCOSE 110 (H) 07/01/2017 1125   GLUCOSE 137 (H) 09/14/2016 0623   BUN 8 07/01/2017 1125   CREATININE 0.72 07/01/2017 1125   CALCIUM 9.2 07/01/2017 1125   GFRNONAA 114 07/01/2017 1125   GFRAA 131 07/01/2017 1125   Lab Results  Component Value Date   HGBA1C 6.0 (H) 07/01/2017   Lab Results  Component Value Date   INSULIN 132.1 (H) 07/01/2017   CBC    Component Value Date/Time   WBC 8.4 07/01/2017 1125   WBC 10.0 09/14/2016 0623   RBC 4.80 07/01/2017 1125   RBC 5.01 09/14/2016 0623   HGB 14.9 07/01/2017 1125   HCT 44.5 07/01/2017 1125   PLT 252 09/14/2016 0623   MCV 93 07/01/2017 1125   MCH 31.0 07/01/2017 1125   MCH 31.7 09/14/2016 0623   MCHC 33.5 07/01/2017 1125   MCHC 34.4 09/14/2016 0623   RDW 13.0 07/01/2017 1125   LYMPHSABS 2.7 07/01/2017 1125   EOSABS 0.1 07/01/2017 1125   BASOSABS 0.0 07/01/2017 1125   Iron/TIBC/Ferritin/ %Sat No results found for: IRON, TIBC, FERRITIN, IRONPCTSAT Lipid Panel     Component Value Date/Time   CHOL 211 (H) 07/01/2017 1125   TRIG 263 (H) 07/01/2017 1125   HDL 38 (L) 07/01/2017 1125   LDLCALC 120 (H) 07/01/2017 1125   Hepatic Function Panel     Component Value Date/Time   PROT 7.2 07/01/2017 1125   ALBUMIN 4.5 07/01/2017 1125   AST 46 (H) 07/01/2017 1125   ALT 64 (H) 07/01/2017 1125   ALKPHOS 78 07/01/2017 1125   BILITOT 0.4 07/01/2017 1125      Component Value Date/Time   TSH 0.881 07/01/2017 1125    ASSESSMENT AND PLAN: Vitamin D deficiency - Plan: Vitamin D, Ergocalciferol, (DRISDOL) 50000 units CAPS capsule  Prediabetes  Class 1 obesity with serious comorbidity and body mass index (BMI) of 30.0 to 30.9 in adult, unspecified obesity type  PLAN:  Vitamin D Deficiency Stacey Morales was informed that low vitamin D levels contributes to fatigue and are associated with obesity, breast, and colon cancer. She agrees  to continue to take prescription Vit D @50 ,000 IU every week and will follow up for routine testing of vitamin D, at least 2-3 times per year. She was informed of the risk of over-replacement of vitamin D and agrees to not increase her dose unless he discusses this with us first.  Pre-Diabetes Stacey Morales will continue to work on weight loss, exercise, and decreasing simple carbohydrates in her diet to help decrease the risk of diabetes. We dicussed metformin including benefits and risks. She was informed that eating too many simple carbohydrates or too many calories at  one sitting increases the likelihood of GI side effects. Jaianna agrees to continue metformin for now and a prescription was not written today. Lineth agreed to follow up with [place] as directed to monitor her progress.  Obesity Brantley is currently in the action stage of change. As such, her goal is to continue with weight loss efforts She has agreed to keep a food journal with 1100 calories and 80 grams of protein daily Desaray has been instructed to work up to a goal of 150 minutes of combined cardio and strengthening exercise per week for weight loss and overall health benefits. We discussed the following Behavioral Modification Strategies today: increasing lean protein intake and work on meal planning and easy cooking plans  Moreen has agreed to follow up with our clinic in 2 weeks. She was informed of the importance of frequent follow up visits to maximize her success with intensive lifestyle modifications for her multiple health conditions.  I, [place] Crane, am acting as transcriptionist for Illa Level, PA-C  I have reviewed the above documentation for accuracy and completeness, and I agree with the above. -Illa Level, PA-C  I have reviewed the above note and agree with the plan. -Quillian Quince, MD   OBESITY BEHAVIORAL INTERVENTION VISIT  Today's visit was # 5 out of 22.  Starting weight: 167 lbs Starting date: 07/01/17 Today's  weight : 160 lbs  Today's date: 09/14/2017 Total lbs lost to date: 7 (Patients must lose 7 lbs in the first 6 months to continue with counseling)   ASK: We discussed the diagnosis of obesity with [name] today and Estefany agreed to give [place] permission to discuss obesity behavioral modification therapy today.  ASSESS: Kenitra has the diagnosis of obesity and her BMI today is 29.26 Unique is in the action stage of change   ADVISE: Alyzae was educated on the multiple health risks of obesity as well as the benefit of weight loss to improve her health. She was advised of the need for long term treatment and the importance of lifestyle modifications.  AGREE: Multiple dietary modification options and treatment options were discussed and  Vickye agreed to keep a food journal with 1100 calories and 80 grams of protein daily We discussed the following Behavioral Modification Strategies today: increasing lean protein intake and work on meal planning and easy cooking plans

## 2017-09-17 DIAGNOSIS — Z319 Encounter for procreative management, unspecified: Secondary | ICD-10-CM | POA: Diagnosis not present

## 2017-09-23 DIAGNOSIS — Z319 Encounter for procreative management, unspecified: Secondary | ICD-10-CM | POA: Diagnosis not present

## 2017-09-28 ENCOUNTER — Ambulatory Visit (INDEPENDENT_AMBULATORY_CARE_PROVIDER_SITE_OTHER): Payer: BLUE CROSS/BLUE SHIELD | Admitting: Physician Assistant

## 2017-09-28 VITALS — BP 114/75 | HR 77 | Temp 98.8°F | Ht 62.0 in | Wt 158.0 lb

## 2017-09-28 DIAGNOSIS — E559 Vitamin D deficiency, unspecified: Secondary | ICD-10-CM

## 2017-09-28 DIAGNOSIS — R7303 Prediabetes: Secondary | ICD-10-CM

## 2017-09-28 DIAGNOSIS — Z6830 Body mass index (BMI) 30.0-30.9, adult: Secondary | ICD-10-CM | POA: Diagnosis not present

## 2017-09-28 DIAGNOSIS — E669 Obesity, unspecified: Secondary | ICD-10-CM

## 2017-09-28 MED ORDER — VITAMIN D (ERGOCALCIFEROL) 1.25 MG (50000 UNIT) PO CAPS: 50000.0000 [IU] | ORAL_CAPSULE | ORAL | 0 refills | Status: DC

## 2017-09-29 NOTE — Progress Notes (Signed)
Office: [phone]  /  Fax: [phone]   HPI:   Chief Complaint: OBESITY Stacey Morales is here to discuss her progress with her obesity treatment plan. She is on the keep a food journal with 1100 calories and 80 grams of protein daily and is following her eating plan approximately 60 % of the time. She states she is exercising 0 minutes 0 times per week. Stacey Morales has been more mindful of her eating and is incorporating more variety with her meals.  Her weight is 158 lb (71.7 kg) today and has had a weight loss of 2 pounds over a period of 2 weeks since her last visit. She has lost 9 lbs since starting treatment with [place].  Vitamin D deficiency Stacey Morales has a diagnosis of vitamin D deficiency. She is currently taking prescription Vit D and denies nausea, vomiting or muscle weakness.  Pre-Diabetes Stacey Morales has a diagnosis of pre-diabetes based on her elevated Hgb A1c and was informed this puts her at greater risk of developing diabetes. She is taking metformin currently and continues to work on diet and exercise to decrease risk of diabetes. She denies polyphagia, nausea, or hypoglycemia.  ALLERGIES: No Known Allergies  MEDICATIONS: Current Outpatient Medications on File Prior to Visit  Medication Sig Dispense Refill  . folic acid (FOLVITE) 400 MCG tablet Take 400 mcg by mouth daily.    . Garlic 1000 MG CAPS Take 1 capsule by mouth daily.    Marland Kitchen letrozole (FEMARA) 2.5 MG tablet Take 2.5 mg by mouth daily. Take 3 pills once a day starting the 3 day of menstral cycle for 5 days    . medroxyPROGESTERone (PROVERA) 10 MG tablet Take 10 mg by mouth daily. Take as directed for 10 days    . metFORMIN (GLUCOPHAGE) 500 MG tablet Take 1 tablet (500 mg total) by mouth daily with breakfast. (Patient taking differently: Take 1,000 mg by mouth 2 (two) times daily with a meal. ) 30 tablet 0  . promethazine (PHENERGAN) 12.5 MG tablet Take 1 tablet (12.5 mg total) by mouth every 8 (eight) hours as needed for nausea or  vomiting. 20 tablet 0   No current facility-administered medications on file prior to visit.     PAST MEDICAL HISTORY: Past Medical History:  Diagnosis Date  . GERD (gastroesophageal reflux disease)   . Kidney stone     PAST SURGICAL HISTORY: Past Surgical History:  Procedure Laterality Date  . ARM SURGERY      SOCIAL HISTORY: Social History   Tobacco Use  . Smoking status: Former Smoker    Years: 14.00    Types: Cigarettes    Last attempt to quit: 02/11/2017    Years since quitting: 0.6  . Smokeless tobacco: Never Used  Substance Use Topics  . Alcohol use: Yes    Comment: occasionally   . Drug use: No    FAMILY HISTORY: Family History  Problem Relation Age of Onset  . Obesity Mother   . Diabetes Maternal Aunt   . Diabetes Maternal Uncle   . Diabetes Maternal Grandmother   . Heart attack Maternal Grandmother   . Diabetes Maternal Grandfather     ROS: Review of Systems  Constitutional: Positive for weight loss.  Gastrointestinal: Negative for nausea and vomiting.  Musculoskeletal:       Negative muscle weakness  Endo/Heme/Allergies:       Negative polyphagia Negative hypoglycemia    PHYSICAL EXAM: Blood pressure 114/75, pulse 77, temperature 98.8 F (37.1 C), temperature source Oral, height  5\' 2"  (1.575 m), weight 158 lb (71.7 kg), last menstrual period 09/08/2017, SpO2 100 %. Body mass index is 28.9 kg/m. Physical Exam  Constitutional: She is oriented to person, place, and time. She appears well-developed and well-nourished.  Cardiovascular: Normal rate.  Pulmonary/Chest: Effort normal.  Musculoskeletal: Normal range of motion.  Neurological: She is oriented to person, place, and time.  Skin: Skin is warm and dry.  Psychiatric: She has a normal mood and affect. Her behavior is normal.  Vitals reviewed.   RECENT LABS AND TESTS: BMET    Component Value Date/Time   NA 139 07/01/2017 1125   K 4.3 07/01/2017 1125   CL 102 07/01/2017 1125   CO2  23 07/01/2017 1125   GLUCOSE 110 (H) 07/01/2017 1125   GLUCOSE 137 (H) 09/14/2016 0623   BUN 8 07/01/2017 1125   CREATININE 0.72 07/01/2017 1125   CALCIUM 9.2 07/01/2017 1125   GFRNONAA 114 07/01/2017 1125   GFRAA 131 07/01/2017 1125   Lab Results  Component Value Date   HGBA1C 6.0 (H) 07/01/2017   Lab Results  Component Value Date   INSULIN 132.1 (H) 07/01/2017   CBC    Component Value Date/Time   WBC 8.4 07/01/2017 1125   WBC 10.0 09/14/2016 0623   RBC 4.80 07/01/2017 1125   RBC 5.01 09/14/2016 0623   HGB 14.9 07/01/2017 1125   HCT 44.5 07/01/2017 1125   PLT 252 09/14/2016 0623   MCV 93 07/01/2017 1125   MCH 31.0 07/01/2017 1125   MCH 31.7 09/14/2016 0623   MCHC 33.5 07/01/2017 1125   MCHC 34.4 09/14/2016 0623   RDW 13.0 07/01/2017 1125   LYMPHSABS 2.7 07/01/2017 1125   EOSABS 0.1 07/01/2017 1125   BASOSABS 0.0 07/01/2017 1125   Iron/TIBC/Ferritin/ %Sat No results found for: IRON, TIBC, FERRITIN, IRONPCTSAT Lipid Panel     Component Value Date/Time   CHOL 211 (H) 07/01/2017 1125   TRIG 263 (H) 07/01/2017 1125   HDL 38 (L) 07/01/2017 1125   LDLCALC 120 (H) 07/01/2017 1125   Hepatic Function Panel     Component Value Date/Time   PROT 7.2 07/01/2017 1125   ALBUMIN 4.5 07/01/2017 1125   AST 46 (H) 07/01/2017 1125   ALT 64 (H) 07/01/2017 1125   ALKPHOS 78 07/01/2017 1125   BILITOT 0.4 07/01/2017 1125      Component Value Date/Time   TSH 0.881 07/01/2017 1125    ASSESSMENT AND PLAN: Vitamin D deficiency - Plan: Vitamin D, Ergocalciferol, (DRISDOL) 50000 units CAPS capsule  Prediabetes  Class 1 obesity with serious comorbidity and body mass index (BMI) of 30.0 to 30.9 in adult, unspecified obesity type - Starting BMI greater then 30  PLAN:  Vitamin D Deficiency Stacey Morales was informed that low vitamin D levels contributes to fatigue and are associated with obesity, breast, and colon cancer. Stacey Morales agrees to continue taking prescription Vit D @50 ,000 IU  every week #4 and we will refill for 1 month. She will follow up for routine testing of vitamin D, at least 2-3 times per year. She was informed of the risk of over-replacement of vitamin D and agrees to not increase her dose unless he discusses this with us first. Stacey Morales agrees to follow up with our clinic in 2 to 3 weeks.  Pre-Diabetes Stacey Morales will continue to work on weight loss, exercise, and decreasing simple carbohydrates in her diet to help decrease the risk of diabetes. We dicussed metformin including benefits and risks. She was informed that eating  too many simple carbohydrates or too many calories at one sitting increases the likelihood of GI side effects. Stacey Morales agrees to continue taking metformin as prescribed. Stacey Morales agrees to follow up with our clinic in 2 to 3 weeks as directed to monitor her progress.  Obesity Stacey Morales is currently in the action stage of change. As such, her goal is to continue with weight loss efforts She has agreed to keep a food journal with 1100 calories and 80 grams of protein daily Stacey Morales has been instructed to work up to a goal of 150 minutes of combined cardio and strengthening exercise per week for weight loss and overall health benefits. We discussed the following Behavioral Modification Strategies today: increasing lean protein intake and work on meal planning and easy cooking plans   Stacey Morales has agreed to follow up with our clinic in 2 to 3 weeks. She was informed of the importance of frequent follow up visits to maximize her success with intensive lifestyle modifications for her multiple health conditions.  I, Burt KnackSharon Martin, am acting as transcriptionist for Illa LevelSahar Loreena Valeri, PA-C  I have reviewed the above documentation for accuracy and completeness, and I agree with the above. -Illa LevelSahar Elon Eoff, PA-C  I have reviewed the above note and agree with the plan. -Quillian Quincearen Beasley, MD     Today's visit was # 6 out of 22.  Starting weight: 167 lbs Starting date:  07/01/17 Today's weight : 158 lbs  Today's date: 09/28/2017 Total lbs lost to date: 9 (Patients must lose 7 lbs in the first 6 months to continue with counseling)   ASK: We discussed the diagnosis of obesity with Stacey Morales today and Stacey Morales agreed to give us permission to discuss obesity behavioral modification therapy today.  ASSESS: Stacey Morales has the diagnosis of obesity and her BMI today is 28.89 Stacey Morales is in the action stage of change   ADVISE: Stacey Morales was educated on the multiple health risks of obesity as well as the benefit of weight loss to improve her health. She was advised of the need for long term treatment and the importance of lifestyle modifications.  AGREE: Multiple dietary modification options and treatment options were discussed and  Stacey Morales agreed to keep a food journal with 1100 calories and 80 grams of protein daily We discussed the following Behavioral Modification Strategies today: increasing lean protein intake and work on meal planning and easy cooking plans

## 2017-10-01 DIAGNOSIS — N979 Female infertility, unspecified: Secondary | ICD-10-CM | POA: Diagnosis not present

## 2017-10-01 DIAGNOSIS — E282 Polycystic ovarian syndrome: Secondary | ICD-10-CM | POA: Diagnosis not present

## 2017-10-20 ENCOUNTER — Ambulatory Visit (INDEPENDENT_AMBULATORY_CARE_PROVIDER_SITE_OTHER): Payer: BLUE CROSS/BLUE SHIELD | Admitting: Physician Assistant

## 2017-10-20 ENCOUNTER — Encounter (INDEPENDENT_AMBULATORY_CARE_PROVIDER_SITE_OTHER): Payer: Self-pay

## 2017-10-20 DIAGNOSIS — Z319 Encounter for procreative management, unspecified: Secondary | ICD-10-CM | POA: Diagnosis not present

## 2017-11-06 ENCOUNTER — Other Ambulatory Visit (INDEPENDENT_AMBULATORY_CARE_PROVIDER_SITE_OTHER): Payer: Self-pay | Admitting: Physician Assistant

## 2017-11-06 DIAGNOSIS — E559 Vitamin D deficiency, unspecified: Secondary | ICD-10-CM

## 2017-11-08 DIAGNOSIS — Z3200 Encounter for pregnancy test, result unknown: Secondary | ICD-10-CM | POA: Diagnosis not present

## 2017-11-10 DIAGNOSIS — Z3201 Encounter for pregnancy test, result positive: Secondary | ICD-10-CM | POA: Diagnosis not present

## 2017-11-15 DIAGNOSIS — O200 Threatened abortion: Secondary | ICD-10-CM | POA: Diagnosis not present

## 2017-11-15 DIAGNOSIS — Z3201 Encounter for pregnancy test, result positive: Secondary | ICD-10-CM | POA: Diagnosis not present

## 2017-11-22 DIAGNOSIS — O039 Complete or unspecified spontaneous abortion without complication: Secondary | ICD-10-CM | POA: Diagnosis not present

## 2017-11-22 DIAGNOSIS — O200 Threatened abortion: Secondary | ICD-10-CM | POA: Diagnosis not present

## 2017-11-29 DIAGNOSIS — O021 Missed abortion: Secondary | ICD-10-CM | POA: Diagnosis not present

## 2017-12-06 DIAGNOSIS — O021 Missed abortion: Secondary | ICD-10-CM | POA: Diagnosis not present

## 2018-01-10 DIAGNOSIS — Z319 Encounter for procreative management, unspecified: Secondary | ICD-10-CM | POA: Diagnosis not present

## 2018-01-10 DIAGNOSIS — N926 Irregular menstruation, unspecified: Secondary | ICD-10-CM | POA: Diagnosis not present

## 2018-01-19 DIAGNOSIS — Z319 Encounter for procreative management, unspecified: Secondary | ICD-10-CM | POA: Diagnosis not present

## 2018-01-20 ENCOUNTER — Telehealth: Payer: Self-pay

## 2018-01-20 NOTE — Telephone Encounter (Signed)
Patient initially scheduled visit via MyChart for 01/25/18.  Called to triage patient given stated symptoms of headaches, nausea, dizziness.  Spoke with patient.  States she has been having more frequent headaches lately.  She has been having nausea with her headaches.  No vomiting, but states she can only eat small meals.  Also some intermittent dizziness.  States that she has not been drinking much water (only about 1 bottle per day).  She has noticed some increased heart rate at times as well.  States her headaches are located in the back of her head near her neck.  No syncope.  No blurred vision.  No chest pain.  No slurred speech.  No difficulty breathing.  Advised patient to increase her water intake as dehydration can contribute to headaches, dizziness, and overall unwell feeling.  Moved patient to earlier appointment on 01/21/2018 with Dr. Earlene PlaterWallace.  Instructed patient to go to ER if she developed any worsening symptoms, syncope, chest pain, etc. before her appointment.  Patient verbalized understanding to all.

## 2018-01-21 ENCOUNTER — Encounter: Payer: Self-pay | Admitting: Family Medicine

## 2018-01-21 ENCOUNTER — Ambulatory Visit: Payer: BLUE CROSS/BLUE SHIELD | Admitting: Family Medicine

## 2018-01-21 VITALS — BP 120/74 | HR 92 | Temp 98.8°F | Ht 62.0 in | Wt 164.6 lb

## 2018-01-21 DIAGNOSIS — R42 Dizziness and giddiness: Secondary | ICD-10-CM | POA: Diagnosis not present

## 2018-01-21 DIAGNOSIS — E282 Polycystic ovarian syndrome: Secondary | ICD-10-CM | POA: Diagnosis not present

## 2018-01-21 DIAGNOSIS — G43009 Migraine without aura, not intractable, without status migrainosus: Secondary | ICD-10-CM

## 2018-01-21 DIAGNOSIS — E559 Vitamin D deficiency, unspecified: Secondary | ICD-10-CM

## 2018-01-21 DIAGNOSIS — Z6831 Body mass index (BMI) 31.0-31.9, adult: Secondary | ICD-10-CM

## 2018-01-21 DIAGNOSIS — E669 Obesity, unspecified: Secondary | ICD-10-CM | POA: Diagnosis not present

## 2018-01-21 DIAGNOSIS — N979 Female infertility, unspecified: Secondary | ICD-10-CM | POA: Diagnosis not present

## 2018-01-21 LAB — CBC WITH DIFFERENTIAL/PLATELET
Basophils Absolute: 0.1 10*3/uL (ref 0.0–0.1)
Basophils Relative: 0.6 % (ref 0.0–3.0)
Eosinophils Absolute: 0.2 10*3/uL (ref 0.0–0.7)
Eosinophils Relative: 2 % (ref 0.0–5.0)
HCT: 44.5 % (ref 36.0–46.0)
Hemoglobin: 15.2 g/dL — ABNORMAL HIGH (ref 12.0–15.0)
Lymphocytes Relative: 35.3 % (ref 12.0–46.0)
Lymphs Abs: 3.4 10*3/uL (ref 0.7–4.0)
MCHC: 34.1 g/dL (ref 30.0–36.0)
MCV: 91.9 fl (ref 78.0–100.0)
Monocytes Absolute: 0.5 10*3/uL (ref 0.1–1.0)
Monocytes Relative: 5.6 % (ref 3.0–12.0)
Neutro Abs: 5.4 10*3/uL (ref 1.4–7.7)
Neutrophils Relative %: 56.5 % (ref 43.0–77.0)
Platelets: 341 10*3/uL (ref 150.0–400.0)
RBC: 4.84 Mil/uL (ref 3.87–5.11)
RDW: 13 % (ref 11.5–15.5)
WBC: 9.5 10*3/uL (ref 4.0–10.5)

## 2018-01-21 LAB — COMPREHENSIVE METABOLIC PANEL
ALT: 36 U/L — ABNORMAL HIGH (ref 0–35)
AST: 23 U/L (ref 0–37)
Albumin: 4.7 g/dL (ref 3.5–5.2)
Alkaline Phosphatase: 63 U/L (ref 39–117)
BUN: 10 mg/dL (ref 6–23)
CO2: 30 mEq/L (ref 19–32)
Calcium: 10.2 mg/dL (ref 8.4–10.5)
Chloride: 100 mEq/L (ref 96–112)
Creatinine, Ser: 0.89 mg/dL (ref 0.40–1.20)
GFR: 79.24 mL/min (ref >60.00)
Glucose, Bld: 68 mg/dL — ABNORMAL LOW (ref 70–99)
Potassium: 4.4 mEq/L (ref 3.5–5.1)
Sodium: 139 mEq/L (ref 135–145)
Total Bilirubin: 0.5 mg/dL (ref 0.2–1.2)
Total Protein: 7.6 g/dL (ref 6.0–8.3)

## 2018-01-21 LAB — MAGNESIUM: Magnesium: 1.9 mg/dL (ref 1.5–2.5)

## 2018-01-21 LAB — HCG, QUANTITATIVE, PREGNANCY: Quantitative HCG: 60.01 m[IU]/mL

## 2018-01-21 MED ORDER — PROMETHAZINE HCL 25 MG PO TABS: 25.0000 mg | ORAL_TABLET | Freq: Three times a day (TID) | ORAL | 0 refills | Status: DC | PRN

## 2018-01-21 MED ORDER — ACETAMINOPHEN 500 MG PO TABS: 1000.0000 mg | ORAL_TABLET | Freq: Three times a day (TID) | ORAL | 0 refills | Status: DC | PRN

## 2018-01-21 MED ORDER — MAGNESIUM 400 MG PO CAPS
1.0000 | ORAL_CAPSULE | Freq: Every day | ORAL | 4 refills | Status: DC
Start: 2018-01-21 — End: 2018-09-27

## 2018-01-21 MED ORDER — CHOLECALCIFEROL 25 MCG (1000 UT) PO TABS: 2000.0000 [IU] | ORAL_TABLET | Freq: Every day | ORAL | 6 refills | Status: DC

## 2018-01-21 NOTE — Telephone Encounter (Signed)
FYI patient is coming in today.

## 2018-01-21 NOTE — Progress Notes (Signed)
Stacey CadetLaura Morales is a 32 y.o. female is here for an ACUTE visit.   History of Present Illness:   Stacey Morales, CMA acting as scribe for Dr. Helane RimaErica Waymond Morales.   HPI:  This patient is presenting today for evaluation of headache and dizziness.  She is currently undergoing fertility treatments.  She does have a history of insulin resistance and PCO S.  She has been on metformin.  Her fertility doctor did start Actos about week and a half ago.  She had an ultrasound last week and will be due for a pregnancy test next week.  A few days ago, she did notice some increased headaches.  She states that she did not plan head did not have lunch.  She grabbed a honey bun from the machine at work.  Right afterwards, she felt dizzy.  This lasted for a few hours.    She no longer has dizziness but still has a mild headache.  It is right-sided and dull.  She is having some difficulty with focus due to the headache.  History of headaches and not on treatment due to wanting to become pregnant..  She has not taken any medications including Tylenol  Health Maintenance Due  Topic Date Due  . TETANUS/TDAP  03/20/2007   [facility] 07/02/2017 07/01/2017 09/26/2016  Decreased Interest 2 2 2   Down, Depressed, Hopeless 2 2 1   PHQ - 2 Score 4 4 3   Altered sleeping 0 3 1  Tired, decreased energy 2 3 3   Change in appetite 2 3 1   Feeling bad or failure about yourself  0 3 0  Trouble concentrating 0 3 0  Moving slowly or fidgety/restless 0 0 0  Suicidal thoughts 0 0 0  PHQ-9 Score 8 19 8   Difficult doing work/chores Somewhat difficult Somewhat difficult Not difficult at all   PMHx, SurgHx, SocialHx, FamHx, Medications, and Allergies were reviewed in the Visit Navigator and updated as appropriate.   Patient Active Problem List   Diagnosis Date Noted  . Female infertility 09/12/2017  . PCOS (polycystic ovarian syndrome) 09/12/2017  . Vitamin D deficiency 09/12/2017  . Shortness of breath on exertion  07/01/2017  . Essential hypertension 07/01/2017  . Class 1 obesity with serious comorbidity and body mass index (BMI) of 31.0 to 31.9 in adult 07/01/2017   Social History   Tobacco Use  . Smoking status: Former Smoker    Years: 14.00    Types: Cigarettes    Last attempt to quit: 02/11/2017    Years since quitting: 0.9  . Smokeless tobacco: Never Used  Substance Use Topics  . Alcohol use: Yes    Comment: occasionally   . Drug use: No   Current Medications and Allergies:   .  folic acid (FOLVITE) 400 MCG tablet, Take 400 mcg by mouth daily., Disp: , Rfl:  .  Garlic 1000 MG CAPS, Take 1 capsule by mouth daily., Disp: , Rfl:  .  letrozole (FEMARA) 2.5 MG tablet, Take 2.5 mg by mouth daily. Take 3 pills once a day starting the 3 day of menstral cycle for 5 days, Disp: , Rfl:  .  metFORMIN (GLUCOPHAGE) 500 MG tablet, Take 1 tablet (500 mg total) by mouth daily with breakfast. (Patient taking differently: Take 1,000 mg by mouth 2 (two) times daily with a meal. ), Disp: 30 tablet, Rfl: 0  No Known Allergies   Review of Systems   Pertinent items are noted in the HPI. Otherwise, ROS is negative.  Vitals:   Vitals:   01/21/18 1254  BP: 120/74  Pulse: 92  Temp: 98.8 F (37.1 C)  TempSrc: Oral  SpO2: 98%  Weight: 164 lb 9.6 oz (74.7 kg)  Height: 5\' 2"  (1.575 m)     Body mass index is 30.11 kg/m.   Physical Exam:   Physical Exam  Constitutional: She is oriented to person, place, and time. She appears well-developed and well-nourished. No distress.  HENT:  Head: Normocephalic and atraumatic.  Right Ear: External ear normal.  Left Ear: External ear normal.  Nose: Nose normal.  Mouth/Throat: Oropharynx is clear and moist.  Eyes: Pupils are equal, round, and reactive to light. Conjunctivae and EOM are normal.  Neck: Normal range of motion. Neck supple. No thyromegaly present.  Cardiovascular: Normal rate, regular rhythm, normal heart sounds and intact distal pulses.    Pulmonary/Chest: Effort normal and breath sounds normal.  Abdominal: Soft. Bowel sounds are normal.  Musculoskeletal: Normal range of motion.  Lymphadenopathy:    She has no cervical adenopathy.  Neurological: She is alert and oriented to person, place, and time.  Skin: Skin is warm and dry. Capillary refill takes less than 2 seconds.  Psychiatric: She has a normal mood and affect. Her behavior is normal.  Nursing note and vitals reviewed.  Assessment and Plan:   Stacey Morales was seen today for headache.  Diagnoses and all orders for this visit:  PCOS (polycystic ovarian syndrome)  Female infertility Comments: Undergoing fertility treatments.  She understands that she needs to stop the Actos if she becomes pregnant.  Class 1 obesity with serious comorbidity and body mass index (BMI) of 31.0 to 31.9 in adult, unspecified obesity type Comments: Working on regular exercise and healthy eating patterns.  Vitamin D deficiency Comments: Previously supplemented with 50,000 international units for 12 weeks.  Will recommend 2000 international units daily from this point going forward. Orders: -     Cholecalciferol 1000 units tablet; Take 2 tablets (2,000 Units total) by mouth daily.  Migraine without aura and without status migrainosus, not intractable Comments: Likely hormonal.  She may take Tylenol.  Phenergan provided as well.  Reviewed the importance of stress reduction and stretching. Orders: -     acetaminophen (TYLENOL) 500 MG tablet; Take 2 tablets (1,000 mg total) by mouth every 8 (eight) hours as needed. -     promethazine (PHENERGAN) 25 MG tablet; Take 1 tablet (25 mg total) by mouth every 8 (eight) hours as needed for nausea or vomiting. -     Magnesium 400 MG CAPS; Take 1 capsule by mouth at bedtime.  Dizziness Comments: Possibly due to hypoglycemia with the new addition of Actos.  I gave her a glucometer to use when she feels poorly. Orders: -     CBC with  Differential/Platelet -     Comprehensive metabolic panel -     B-HCG Quant -     Magnesium    . Reviewed expectations re: course of current medical issues. . Discussed self-management of symptoms. . Outlined signs and symptoms indicating need for more acute intervention. . Patient verbalized understanding and all questions were answered. Marland Kitchen Health Maintenance issues including appropriate healthy diet, exercise, and smoking avoidance were discussed with patient. . See orders for this visit as documented in the electronic medical record. . Patient received an After Visit Summary.  Stacey Rima, DO [place] 01/21/2018  No future appointments.

## 2018-01-24 ENCOUNTER — Ambulatory Visit: Payer: BLUE CROSS/BLUE SHIELD | Admitting: Family Medicine

## 2018-01-24 ENCOUNTER — Encounter: Payer: Self-pay | Admitting: Family Medicine

## 2018-01-24 VITALS — BP 126/80 | HR 105 | Temp 99.7°F | Ht 62.0 in | Wt 164.8 lb

## 2018-01-24 DIAGNOSIS — R1013 Epigastric pain: Secondary | ICD-10-CM | POA: Diagnosis not present

## 2018-01-24 DIAGNOSIS — Z3A01 Less than 8 weeks gestation of pregnancy: Secondary | ICD-10-CM | POA: Diagnosis not present

## 2018-01-24 DIAGNOSIS — E349 Endocrine disorder, unspecified: Secondary | ICD-10-CM

## 2018-01-24 DIAGNOSIS — R945 Abnormal results of liver function studies: Secondary | ICD-10-CM

## 2018-01-24 DIAGNOSIS — R7989 Other specified abnormal findings of blood chemistry: Secondary | ICD-10-CM

## 2018-01-24 LAB — HCG, QUANTITATIVE, PREGNANCY: Quantitative HCG: 13.76 m[IU]/mL

## 2018-01-24 MED ORDER — RANITIDINE HCL 150 MG PO TABS: 150.0000 mg | ORAL_TABLET | Freq: Two times a day (BID) | ORAL | 2 refills | Status: DC

## 2018-01-24 NOTE — Progress Notes (Addendum)
Stacey Morales is a 32 y.o. female is here for follow up.  History of Present Illness:   HPI:  Patient presents for follow-up of labs.  Most notable labs included a blood sugar of 69, a slightly elevated liver function test, and an elevated beta quant.  Today, she reports feeling slightly dizzy and nauseated.  No loss of consciousness.  She is also reporting some slight dyspepsia and endorses a history of heartburn for which she normally takes Tums.  She has been undergoing fertility treatment and is excited about the possibility of pregnancy today.  Health Maintenance Due  Topic Date Due  . TETANUS/TDAP  03/20/2007   [facility] 07/02/2017 07/01/2017 09/26/2016  Decreased Interest 2 2 2   Down, Depressed, Hopeless 2 2 1   PHQ - 2 Score 4 4 3   Altered sleeping 0 3 1  Tired, decreased energy 2 3 3   Change in appetite 2 3 1   Feeling bad or failure about yourself  0 3 0  Trouble concentrating 0 3 0  Moving slowly or fidgety/restless 0 0 0  Suicidal thoughts 0 0 0  PHQ-9 Score 8 19 8   Difficult doing work/chores Somewhat difficult Somewhat difficult Not difficult at all   PMHx, SurgHx, SocialHx, FamHx, Medications, and Allergies were reviewed in the Visit Navigator and updated as appropriate.   Patient Active Problem List   Diagnosis Date Noted  . Female infertility 09/12/2017  . PCOS (polycystic ovarian syndrome) 09/12/2017  . Vitamin D deficiency 09/12/2017  . Shortness of breath on exertion 07/01/2017  . Essential hypertension 07/01/2017  . Class 1 obesity with serious comorbidity and body mass index (BMI) of 31.0 to 31.9 in adult 07/01/2017   Social History   Tobacco Use  . Smoking status: Former Smoker    Years: 14.00    Types: Cigarettes    Last attempt to quit: 02/11/2017    Years since quitting: 1.0  . Smokeless tobacco: Never Used  Substance Use Topics  . Alcohol use: Yes    Comment: occasionally   . Drug use: No   Current Medications and Allergies:     Current Outpatient Medications:  .  acetaminophen (TYLENOL) 500 MG tablet, Take 2 tablets (1,000 mg total) by mouth every 8 (eight) hours as needed., Disp: 60 tablet, Rfl: 0 .  Cholecalciferol 1000 units tablet, Take 2 tablets (2,000 Units total) by mouth daily., Disp: 60 tablet, Rfl: 6 .  folic acid (FOLVITE) 400 MCG tablet, Take 400 mcg by mouth daily., Disp: , Rfl:  .  letrozole (FEMARA) 2.5 MG tablet, Take 2.5 mg by mouth daily. Take 3 pills once a day starting the 3 day of menstral cycle for 5 days, Disp: , Rfl:  .  Magnesium 400 MG CAPS, Take 1 capsule by mouth at bedtime., Disp: 30 capsule, Rfl: 4 .  metFORMIN (GLUCOPHAGE) 500 MG tablet, Take 1 tablet (500 mg total) by mouth daily with breakfast. (Patient taking differently: Take 1,000 mg by mouth 2 (two) times daily with a meal. ), Disp: 30 tablet, Rfl: 0 .  OVIDREL 250 MCG/0.5ML injection, , Disp: , Rfl: 3 .  promethazine (PHENERGAN) 25 MG tablet, Take 1 tablet (25 mg total) by mouth every 8 (eight) hours as needed for nausea or vomiting., Disp: 20 tablet, Rfl: 0 .  albuterol (PROVENTIL HFA;VENTOLIN HFA) 108 (90 Base) MCG/ACT inhaler, Inhale 2 puffs into the lungs every 6 (six) hours as needed for wheezing or shortness of breath., Disp: 1 Inhaler, Rfl: 0 .  ranitidine (ZANTAC) 150 MG tablet, Take 1 tablet (150 mg total) by mouth 2 (two) times daily., Disp: 60 tablet, Rfl: 2 .  ranitidine (ZANTAC) 150 MG tablet, Take 1 tablet (150 mg total) by mouth 2 (two) times daily., Disp: 30 tablet, Rfl: 0 .  sertraline (ZOLOFT) 50 MG tablet, Take half tablet at bed time., Disp: 30 tablet, Rfl: 0  No Known Allergies Review of Systems   Pertinent items are noted in the HPI. Otherwise, ROS is negative.  Vitals:   Vitals:   01/24/18 1323  BP: 126/80  Pulse: (!) 105  Temp: 99.7 F (37.6 C)  TempSrc: Oral  SpO2: 99%  Weight: 164 lb 12.8 oz (74.8 kg)  Height: 5\' 2"  (1.575 m)     Body mass index is 30.14 kg/m.   Physical Exam:    Physical Exam  Constitutional: She appears well-nourished.  HENT:  Head: Normocephalic and atraumatic.  Eyes: Pupils are equal, round, and reactive to light. EOM are normal.  Neck: Normal range of motion. Neck supple.  Cardiovascular: Normal rate, regular rhythm, normal heart sounds and intact distal pulses.  Pulmonary/Chest: Effort normal.  Abdominal: Soft.  Skin: Skin is warm.  Psychiatric: She has a normal mood and affect. Her behavior is normal.  Nursing note and vitals reviewed.    Results for orders placed or performed in visit on 01/21/18  CBC with Differential/Platelet  Result Value Ref Range   WBC 9.5 4.0 - 10.5 K/uL   RBC 4.84 3.87 - 5.11 Mil/uL   Hemoglobin 15.2 (H) 12.0 - 15.0 g/dL   HCT 16.1 09.6 - 04.5 %   MCV 91.9 78.0 - 100.0 fl   MCHC 34.1 30.0 - 36.0 g/dL   RDW 40.9 81.1 - 91.4 %   Platelets 341.0 150.0 - 400.0 K/uL   Neutrophils Relative % 56.5 43.0 - 77.0 %   Lymphocytes Relative 35.3 12.0 - 46.0 %   Monocytes Relative 5.6 3.0 - 12.0 %   Eosinophils Relative 2.0 0.0 - 5.0 %   Basophils Relative 0.6 0.0 - 3.0 %   Neutro Abs 5.4 1.4 - 7.7 K/uL   Lymphs Abs 3.4 0.7 - 4.0 K/uL   Monocytes Absolute 0.5 0.1 - 1.0 K/uL   Eosinophils Absolute 0.2 0.0 - 0.7 K/uL   Basophils Absolute 0.1 0.0 - 0.1 K/uL  Comprehensive metabolic panel  Result Value Ref Range   Sodium 139 135 - 145 mEq/L   Potassium 4.4 3.5 - 5.1 mEq/L   Chloride 100 96 - 112 mEq/L   CO2 30 19 - 32 mEq/L   Glucose, Bld 68 (L) 70 - 99 mg/dL   BUN 10 6 - 23 mg/dL   Creatinine, Ser 7.82 0.40 - 1.20 mg/dL   Total Bilirubin 0.5 0.2 - 1.2 mg/dL   Alkaline Phosphatase 63 39 - 117 U/L   AST 23 0 - 37 U/L   ALT 36 (H) 0 - 35 U/L   Total Protein 7.6 6.0 - 8.3 g/dL   Albumin 4.7 3.5 - 5.2 g/dL   Calcium 95.6 8.4 - 21.3 mg/dL   GFR 08.65 >78.46 mL/min  B-HCG Quant  Result Value Ref Range   Quantitative HCG 60.01 mIU/ml  Magnesium  Result Value Ref Range   Magnesium 1.9 1.5 - 2.5 mg/dL     Assessment and Plan:   Diagnoses and all orders for this visit:  Elevated serum hCG Comments: She will stop the Actos today.  Second beta quant today. Orders: -  hCG, quantitative, pregnancy  Dyspepsia Comments: Okay Zantac.  Reviewed diet options.  Elevated LFTs Comments: History of fatty liver.  No need for another ultrasound today.  Other orders -     ranitidine (ZANTAC) 150 MG tablet; Take 1 tablet (150 mg total) by mouth 2 (two) times daily.    . Reviewed expectations re: course of current medical issues. . Discussed self-management of symptoms. . Outlined signs and symptoms indicating need for more acute intervention. . Patient verbalized understanding and all questions were answered. Marland Kitchen Health Maintenance issues including appropriate healthy diet, exercise, and smoking avoidance were discussed with patient. . See orders for this visit as documented in the electronic medical record. . Patient received an After Visit Summary.  Helane Rima, DO [place], [facility] 03/13/2018  No future appointments.

## 2018-01-25 ENCOUNTER — Ambulatory Visit: Payer: BLUE CROSS/BLUE SHIELD | Admitting: Family Medicine

## 2018-02-15 DIAGNOSIS — Z319 Encounter for procreative management, unspecified: Secondary | ICD-10-CM | POA: Diagnosis not present

## 2018-03-08 ENCOUNTER — Ambulatory Visit: Payer: Self-pay | Admitting: Family Medicine

## 2018-03-08 NOTE — Telephone Encounter (Signed)
Already talked to patient she is going to ED today.

## 2018-03-08 NOTE — Telephone Encounter (Signed)
Patient states she has feeling of an irritant in her throat and that she can not breath. Patient hears a sound- patient states she feels that way briefly- may a minute and then she calms down and she breaths normally. Patient states the episodes happens some times out of nowhere sleeping, watching TV, paniced. Patient thinks that she may have some depression. Discussed the changes patient is going through and her symptoms- she does have an appointment for Friday- she will call back with any changes.  Reason for Disposition . [1] Symptoms of anxiety or panic AND [2] has not been evaluated for this by physician  Answer Assessment - Initial Assessment Questions 1. CONCERN: "What happened that made you call today?"     Episode of breathing p[roblem on Sunday 2. ANXIETY SYMPTOM SCREENING: "Can you describe how you have been feeling?"  (e.g., tense, restless, panicky, anxious, keyed up, trouble sleeping, trouble concentrating)     Yes- feeling anxious for no reason 3. ONSET: "How long have you been feeling this way?"     2 months- not every day 4. RECURRENT: "Have you felt this way before?"  If yes: "What happened that time?" "What helped these feelings go away in the past?"      Not that she can recognize- patient has trouble sleeping too- has pressure on her head 5. RISK OF HARM - SUICIDAL IDEATION:  "Do you ever have thoughts of hurting or killing yourself?"  (e.g., yes, no, no but preoccupation with thoughts about death)   - INTENT:  "Do you have thoughts of hurting or killing yourself right NOW?" (e.g., yes, no, N/A)   - PLAN: "Do you have a specific plan for how you would do this?" (e.g., gun, knife, overdose, no plan, N/A)     No- no plans for that 6. RISK OF HARM - HOMICIDAL IDEATION:  "Do you ever have thoughts of hurting or killing someone else?"  (e.g., yes, no, no but preoccupation with thoughts about death)   - INTENT:  "Do you have thoughts of hurting or killing someone right NOW?"  (e.g., yes, no, N/A)   - PLAN: "Do you have a specific plan for how you would do this?" (e.g., gun, knife, no plan, N/A)      No- no plan for that 7. FUNCTIONAL IMPAIRMENT: "How have things been going for you overall in your life? Have you had any more difficulties than usual doing your normal daily activities?"  (e.g., better, same, worse; self-care, school, work, interactions)     Patient has a lot of work and she has been working overtime- she is taking classes 8. SUPPORT: "Who is with you now?" "Who do you live with?" "Do you have family or friends nearby who you can talk to?"      Husband is good support 9. THERAPIST: "Do you have a counselor or therapist? Name?"     Patient had miscarriage in January-   10. STRESSORS: "Has there been any new stress or recent changes in your life?"       Patient has had some stresses 11. CAFFEINE ABUSE: "Do you drink caffeinated beverages, and how much each day?" (e.g., coffee, tea, colas)       1 cup in morning 12. SUBSTANCE ABUSE: "Do you use any illegal drugs or alcohol?"       Last week- patient did smoke 3 cigarettes 13. OTHER SYMPTOMS: "Do you have any other physical symptoms right now?" (e.g., chest pain, palpitations, difficulty breathing, fever)  Chest pain 14. PREGNANCY: "Is there any chance you are pregnant?" "When was your last menstrual period?"       LMP-5/20  Protocols used: ANXIETY AND PANIC ATTACK-A-AH

## 2018-03-10 DIAGNOSIS — Z319 Encounter for procreative management, unspecified: Secondary | ICD-10-CM | POA: Diagnosis not present

## 2018-03-11 ENCOUNTER — Encounter: Payer: Self-pay | Admitting: Family Medicine

## 2018-03-11 ENCOUNTER — Ambulatory Visit: Payer: BLUE CROSS/BLUE SHIELD | Admitting: Family Medicine

## 2018-03-11 VITALS — BP 130/84 | HR 99 | Temp 98.9°F | Ht 62.0 in | Wt 166.6 lb

## 2018-03-11 DIAGNOSIS — R09A2 Foreign body sensation, throat: Secondary | ICD-10-CM

## 2018-03-11 DIAGNOSIS — R0989 Other specified symptoms and signs involving the circulatory and respiratory systems: Secondary | ICD-10-CM

## 2018-03-11 DIAGNOSIS — F4323 Adjustment disorder with mixed anxiety and depressed mood: Secondary | ICD-10-CM | POA: Diagnosis not present

## 2018-03-11 DIAGNOSIS — F458 Other somatoform disorders: Secondary | ICD-10-CM | POA: Diagnosis not present

## 2018-03-11 DIAGNOSIS — R198 Other specified symptoms and signs involving the digestive system and abdomen: Secondary | ICD-10-CM

## 2018-03-11 MED ORDER — RANITIDINE HCL 150 MG PO TABS: 150.0000 mg | ORAL_TABLET | Freq: Two times a day (BID) | ORAL | 0 refills | Status: DC

## 2018-03-11 MED ORDER — SERTRALINE HCL 50 MG PO TABS
ORAL_TABLET | ORAL | 0 refills | Status: DC
Start: 2018-03-11 — End: 2018-09-27

## 2018-03-11 MED ORDER — ALBUTEROL SULFATE HFA 108 (90 BASE) MCG/ACT IN AERS: 2.0000 | INHALATION_SPRAY | Freq: Four times a day (QID) | RESPIRATORY_TRACT | 0 refills | Status: DC | PRN

## 2018-03-11 NOTE — Progress Notes (Signed)
Stacey CadetLaura Morales is a 32 y.o. female is here for follow up.  History of Present Illness:   HPI: Patient presents with complaint of feeling of something in her throat.  None now.  Intermittent.  She has had 2-3 episodes of this happening.  She believes that the episodes are associated with anxiety.  She felt a panicked feeling just before she describes the feeling of her throat closing up.  She used deep breathing for a few minutes to resolve the symptoms.  She has been going through fertility treatments and has been disappointed and her lack of success so far.  She denies headaches, dizziness, chest pain, shortness of breath, cough, congestion, reflux, abdominal pain, edema.  She has been tolerating all of her medications.  She admits to some crying episodes.  No suicidal or homicidal thoughts.   Health Maintenance Due  Topic Date Due  . TETANUS/TDAP  03/20/2007   [facility] 07/02/2017 07/01/2017 09/26/2016  Decreased Interest 2 2 2   Down, Depressed, Hopeless 2 2 1   PHQ - 2 Score 4 4 3   Altered sleeping 0 3 1  Tired, decreased energy 2 3 3   Change in appetite 2 3 1   Feeling bad or failure about yourself  0 3 0  Trouble concentrating 0 3 0  Moving slowly or fidgety/restless 0 0 0  Suicidal thoughts 0 0 0  PHQ-9 Score 8 19 8   Difficult doing work/chores Somewhat difficult Somewhat difficult Not difficult at all   PMHx, SurgHx, SocialHx, FamHx, Medications, and Allergies were reviewed in the Visit Navigator and updated as appropriate.   Patient Active Problem List   Diagnosis Date Noted  . Female infertility 09/12/2017  . PCOS (polycystic ovarian syndrome) 09/12/2017  . Vitamin D deficiency 09/12/2017  . Shortness of breath on exertion 07/01/2017  . Essential hypertension 07/01/2017  . Class 1 obesity with serious comorbidity and body mass index (BMI) of 31.0 to 31.9 in adult 07/01/2017   Social History   Tobacco Use  . Smoking status: Former Smoker    Years: 14.00      Types: Cigarettes    Last attempt to quit: 02/11/2017    Years since quitting: 1.0  . Smokeless tobacco: Never Used  Substance Use Topics  . Alcohol use: Yes    Comment: occasionally   . Drug use: No   Current Medications and Allergies:   Current Outpatient Medications:  .  acetaminophen (TYLENOL) 500 MG tablet, Take 2 tablets (1,000 mg total) by mouth every 8 (eight) hours as needed., Disp: 60 tablet, Rfl: 0 .  Cholecalciferol 1000 units tablet, Take 2 tablets (2,000 Units total) by mouth daily., Disp: 60 tablet, Rfl: 6 .  folic acid (FOLVITE) 400 MCG tablet, Take 400 mcg by mouth daily., Disp: , Rfl:  .  letrozole (FEMARA) 2.5 MG tablet, Take 2.5 mg by mouth daily. Take 3 pills once a day starting the 3 day of menstral cycle for 5 days, Disp: , Rfl:  .  Magnesium 400 MG CAPS, Take 1 capsule by mouth at bedtime., Disp: 30 capsule, Rfl: 4 .  metFORMIN (GLUCOPHAGE) 500 MG tablet, Take 1 tablet (500 mg total) by mouth daily with breakfast. (Patient taking differently: Take 1,000 mg by mouth 2 (two) times daily with a meal. ), Disp: 30 tablet, Rfl: 0 .  OVIDREL 250 MCG/0.5ML injection, , Disp: , Rfl: 3 .  promethazine (PHENERGAN) 25 MG tablet, Take 1 tablet (25 mg total) by mouth every 8 (eight) hours  as needed for nausea or vomiting., Disp: 20 tablet, Rfl: 0 .  ranitidine (ZANTAC) 150 MG tablet, Take 1 tablet (150 mg total) by mouth 2 (two) times daily., Disp: 60 tablet, Rfl: 2  No Known Allergies Review of Systems   Pertinent items are noted in the HPI. Otherwise, ROS is negative.  Vitals:   Vitals:   03/11/18 1347  BP: 130/84  Pulse: 99  Temp: 98.9 F (37.2 C)  TempSrc: Oral  SpO2: 98%  Weight: 166 lb 9.6 oz (75.6 kg)  Height: 5\' 2"  (1.575 m)     Body mass index is 30.47 kg/m.  Physical Exam:   Physical Exam  Constitutional: She is oriented to person, place, and time. She appears well-developed and well-nourished. No distress.  HENT:  Head: Normocephalic and  atraumatic.  Right Ear: External ear normal.  Left Ear: External ear normal.  Nose: Nose normal.  Mouth/Throat: Oropharynx is clear and moist.  Eyes: Pupils are equal, round, and reactive to light. Conjunctivae and EOM are normal.  Neck: Normal range of motion. Neck supple. No thyromegaly present.  Cardiovascular: Normal rate, regular rhythm, normal heart sounds and intact distal pulses.  Pulmonary/Chest: Effort normal and breath sounds normal.  Abdominal: Soft. Bowel sounds are normal.  Musculoskeletal: Normal range of motion.  Lymphadenopathy:    She has no cervical adenopathy.  Neurological: She is alert and oriented to person, place, and time.  Skin: Skin is warm and dry. Capillary refill takes less than 2 seconds.  Psychiatric: She has a normal mood and affect. Her behavior is normal.  Nursing note and vitals reviewed.  Results for orders placed or performed in visit on 01/24/18  hCG, quantitative, pregnancy  Result Value Ref Range   Quantitative HCG 13.76 mIU/ml    Assessment and Plan:   Diagnoses and all orders for this visit:  Globus sensation -     ranitidine (ZANTAC) 150 MG tablet; Take 1 tablet (150 mg total) by mouth 2 (two) times daily. -     albuterol (PROVENTIL HFA;VENTOLIN HFA) 108 (90 Base) MCG/ACT inhaler; Inhale 2 puffs into the lungs every 6 (six) hours as needed for wheezing or shortness of breath.  Situational mixed anxiety and depressive disorder -     sertraline (ZOLOFT) 50 MG tablet; Take half tablet at bed time.    . Reviewed expectations re: course of current medical issues. . Discussed self-management of symptoms. . Outlined signs and symptoms indicating need for more acute intervention. . Patient verbalized understanding and all questions were answered. Marland Kitchen Health Maintenance issues including appropriate healthy diet, exercise, and smoking avoidance were discussed with patient. . See orders for this visit as documented in the electronic medical  record. . Patient received an After Visit Summary.  Helane Rima, DO [place], [facility] 03/13/2018  No future appointments.

## 2018-03-13 ENCOUNTER — Encounter: Payer: Self-pay | Admitting: Family Medicine

## 2018-04-08 DIAGNOSIS — Z319 Encounter for procreative management, unspecified: Secondary | ICD-10-CM | POA: Diagnosis not present

## 2018-08-01 ENCOUNTER — Ambulatory Visit: Payer: Self-pay | Admitting: *Deleted

## 2018-08-01 NOTE — Telephone Encounter (Signed)
Patient has appointment tomorrow

## 2018-08-01 NOTE — Telephone Encounter (Signed)
Elby Beck   ----- Message -----  From: Stacey Morales  Sent: 08/01/2018  2:31 PM EDT  To: Marja Kays Pool  Subject: Appointment scheduled from MyChart          Appointment For: Stacey Morales ([id number])  Visit Type: MYCHART OFFICE VISIT (1064)    08/09/2018  7:00 AM 20 mins. Helane Rima, DO    [facility]    Patient Comments:  Insomnia,  Nauseas, almost every day.   Heartburn, almost every day.   Headaches, every night and sometimes during the day.   My heart goes faster sometimes after meal, y feel a little bit of pain too. This happens at least once a day.  Knee pain.   Call to patient- patient is complaining of increased heart rate at rest. Patient states she can be sitting and have her heart rate go from normal to 98-104. She has pain with that and then it goes back down. Patient states she does suffer with anxiety and she has been to the hospital and UC for this before and they have not been able to tell her why this happens. Appointment to evaluate the racing heart.  Reason for Disposition . [1] Palpitations AND [2] no improvement after using CARE ADVICE  Answer Assessment - Initial Assessment Questions 1. DESCRIPTION: "Please describe your heart rate or heart beat that you are having" (e.g., fast/slow, regular/irregular, skipped or extra beats, "palpitations")     Daily increase of heart rate up to 102- 98- patient can be sitting still.  2. ONSET: "When did it start?" (Minutes, hours or days)      Patient states she has been feeling this for 3 weeks- although she states she has had symptoms that have been increasing over years 3. DURATION: "How long does it last" (e.g., seconds, minutes, hours)     10 minutes to less than that 4. PATTERN "Does it come and go, or has it been constant since it started?"  "Does it get worse with exertion?"   "Are you feeling it now?"     Comes and goes, exertion makes pulse go up- but not with the pain, now 5. TAP: "Using  your hand, can you tap out what you are feeling on a chair or table in front of you, so that I can hear?" (Note: not all patients can do this)       Fast and regular 6. HEART RATE: "Can you tell me your heart rate?" "How many beats in 15 seconds?"  (Note: not all patients can do this)       98-85- during conversation- patient has pain on R shoulder, on neck, left side- moves 7. RECURRENT SYMPTOM: "Have you ever had this before?" If so, ask: "When was the last time?" and "What happened that time?"      Pain has been occurring for some time now- but becoming more often 8. CAUSE: "What do you think is causing the palpitations?"     Normally she has the racing heart after the meal-after breakfast, she gets headache and nausea, chest pain 9. CARDIAC HISTORY: "Do you have any history of heart disease?" (e.g., heart attack, angina, bypass surgery, angioplasty, arrhythmia)      Patient has been to hospital/UC 6 months ago- they did not find anything wrong 10. OTHER SYMPTOMS: "Do you have any other symptoms?" (e.g., dizziness, chest pain, sweating, difficulty breathing)       Patient has panic attacks- she has breathing difficultly when she has pain  11. PREGNANCY: "Is there any chance you are pregnant?" "When was your last menstrual period?"       Patient is trying- LMP- 07/28/18  Protocols used: HEART RATE AND HEARTBEAT QUESTIONS-A-AH

## 2018-08-01 NOTE — Telephone Encounter (Signed)
See note

## 2018-08-02 ENCOUNTER — Encounter: Payer: Self-pay | Admitting: Family Medicine

## 2018-08-02 ENCOUNTER — Ambulatory Visit: Payer: BLUE CROSS/BLUE SHIELD | Admitting: Family Medicine

## 2018-08-02 VITALS — BP 114/78 | HR 85 | Temp 98.3°F | Ht 62.0 in | Wt 174.2 lb

## 2018-08-02 DIAGNOSIS — R7989 Other specified abnormal findings of blood chemistry: Secondary | ICD-10-CM

## 2018-08-02 DIAGNOSIS — E7849 Other hyperlipidemia: Secondary | ICD-10-CM

## 2018-08-02 DIAGNOSIS — R51 Headache: Secondary | ICD-10-CM

## 2018-08-02 DIAGNOSIS — E282 Polycystic ovarian syndrome: Secondary | ICD-10-CM | POA: Diagnosis not present

## 2018-08-02 DIAGNOSIS — R7303 Prediabetes: Secondary | ICD-10-CM

## 2018-08-02 DIAGNOSIS — G4730 Sleep apnea, unspecified: Secondary | ICD-10-CM

## 2018-08-02 DIAGNOSIS — R519 Headache, unspecified: Secondary | ICD-10-CM

## 2018-08-02 DIAGNOSIS — R945 Abnormal results of liver function studies: Secondary | ICD-10-CM

## 2018-08-02 DIAGNOSIS — N912 Amenorrhea, unspecified: Secondary | ICD-10-CM

## 2018-08-02 DIAGNOSIS — R1013 Epigastric pain: Secondary | ICD-10-CM

## 2018-08-02 DIAGNOSIS — R000 Tachycardia, unspecified: Secondary | ICD-10-CM

## 2018-08-02 LAB — CBC WITH DIFFERENTIAL/PLATELET
Basophils Absolute: 0.1 10*3/uL (ref 0.0–0.1)
Basophils Relative: 0.8 % (ref 0.0–3.0)
Eosinophils Absolute: 0.2 10*3/uL (ref 0.0–0.7)
Eosinophils Relative: 2 % (ref 0.0–5.0)
HCT: 46.8 % — ABNORMAL HIGH (ref 36.0–46.0)
Hemoglobin: 16.1 g/dL — ABNORMAL HIGH (ref 12.0–15.0)
Lymphocytes Relative: 27.3 % (ref 12.0–46.0)
Lymphs Abs: 2.5 10*3/uL (ref 0.7–4.0)
MCHC: 34.4 g/dL (ref 30.0–36.0)
MCV: 90.9 fl (ref 78.0–100.0)
Monocytes Absolute: 0.5 10*3/uL (ref 0.1–1.0)
Monocytes Relative: 5 % (ref 3.0–12.0)
Neutro Abs: 6 10*3/uL (ref 1.4–7.7)
Neutrophils Relative %: 64.9 % (ref 43.0–77.0)
Platelets: 262 10*3/uL (ref 150.0–400.0)
RBC: 5.15 Mil/uL — ABNORMAL HIGH (ref 3.87–5.11)
RDW: 12.9 % (ref 11.5–15.5)
WBC: 9.3 10*3/uL (ref 4.0–10.5)

## 2018-08-02 LAB — LIPID PANEL
Cholesterol: 223 mg/dL — ABNORMAL HIGH (ref 0–200)
HDL: 43.1 mg/dL (ref >39.00)
NonHDL: 179.82
Total CHOL/HDL Ratio: 5
Triglycerides: 243 mg/dL — ABNORMAL HIGH (ref 0.0–149.0)
VLDL: 48.6 mg/dL — ABNORMAL HIGH (ref 0.0–40.0)

## 2018-08-02 LAB — LIPASE: Lipase: 38 U/L (ref 11.0–59.0)

## 2018-08-02 LAB — COMPREHENSIVE METABOLIC PANEL
ALT: 71 U/L — ABNORMAL HIGH (ref 0–35)
AST: 46 U/L — ABNORMAL HIGH (ref 0–37)
Albumin: 4.6 g/dL (ref 3.5–5.2)
Alkaline Phosphatase: 71 U/L (ref 39–117)
BUN: 11 mg/dL (ref 6–23)
CO2: 29 mEq/L (ref 19–32)
Calcium: 9.6 mg/dL (ref 8.4–10.5)
Chloride: 103 mEq/L (ref 96–112)
Creatinine, Ser: 0.88 mg/dL (ref 0.40–1.20)
GFR: 79.99 mL/min (ref >60.00)
Glucose, Bld: 151 mg/dL — ABNORMAL HIGH (ref 70–99)
Potassium: 4.4 mEq/L (ref 3.5–5.1)
Sodium: 139 mEq/L (ref 135–145)
Total Bilirubin: 0.5 mg/dL (ref 0.2–1.2)
Total Protein: 7.6 g/dL (ref 6.0–8.3)

## 2018-08-02 LAB — H. PYLORI ANTIBODY, IGG: H Pylori IgG: POSITIVE — AB

## 2018-08-02 LAB — TSH: TSH: 1.21 u[IU]/mL (ref 0.35–4.50)

## 2018-08-02 LAB — LDL CHOLESTEROL, DIRECT: Direct LDL: 149 mg/dL

## 2018-08-02 LAB — HCG, QUANTITATIVE, PREGNANCY: Quantitative HCG: 0.06 m[IU]/mL

## 2018-08-02 LAB — HEMOGLOBIN A1C: Hgb A1c MFr Bld: 6.9 % — ABNORMAL HIGH (ref 4.6–6.5)

## 2018-08-02 MED ORDER — MELOXICAM 15 MG PO TABS: 15.0000 mg | ORAL_TABLET | Freq: Every day | ORAL | 0 refills | Status: DC

## 2018-08-02 MED ORDER — PANTOPRAZOLE SODIUM 40 MG PO TBEC: 40.0000 mg | DELAYED_RELEASE_TABLET | Freq: Every day | ORAL | 3 refills | Status: DC

## 2018-08-02 NOTE — Progress Notes (Signed)
Stacey Morales is a 32 y.o. female here for an acute visit.  History of Present Illness:   HPI: She stopped fertility treatments since our last visit.   1. Dyspepsia. Continues daily. Zantac helps temporarily. She is nervous to eat now. Pain at epigastrum with radiation to left shoulder and back.   2. Prediabetes. Stopped Metformin.   Lab Results  Component Value Date   HGBA1C 6.0 (H) 07/01/2017     3. Headache. Nearly daily. Sometimes wakes with it. Pounding. No N/V. No dizziness, photo/phonophobia. No neck pain.   4. Sleep disorder breathing. Snores. Wakes throughout the night.     5. Hyperlipidemia.   Trying to exercise on a regular basis? []   YES  [x]   NO Compliant with diet? []   YES  [x]   NO   Lipids:    Component Value Date/Time   CHOL 211 (H) 07/01/2017 1125   TRIG 263 (H) 07/01/2017 1125   HDL 38 (L) 07/01/2017 1125     6. Tachycardia. After eating. Last a few minutes.     7. Amenorrhea. With PCOS. Last menses June 17. States that she has spotting monthly. Took negative pregnancy.     PMHx, SurgHx, SocialHx, Medications, and Allergies were reviewed in the Visit Navigator and updated as appropriate.  Current Medications:   .  acetaminophen (TYLENOL) 500 MG tablet, Take 2 tablets (1,000 mg total) by mouth every 8 (eight) hours as needed., Disp: 60 tablet, Rfl: 0 .  albuterol (PROVENTIL HFA;VENTOLIN HFA) 108 (90 Base) MCG/ACT inhaler, Inhale 2 puffs into the lungs every 6 (six) hours as needed for wheezing or shortness of breath., Disp: 1 Inhaler, Rfl: 0 .  Cholecalciferol 1000 units tablet, Take 2 tablets (2,000 Units total) by mouth daily., Disp: 60 tablet, Rfl: 6 .  folic acid (FOLVITE) 400 MCG tablet, Take 400 mcg by mouth daily., Disp: , Rfl:  .  Magnesium 400 MG CAPS, Take 1 capsule by mouth at bedtime., Disp: 30 capsule, Rfl: 4 .  ranitidine (ZANTAC) 150 MG tablet, Take 1 tablet (150 mg total) by mouth 2 (two) times daily., Disp: 30 tablet, Rfl: 0 .   sertraline (ZOLOFT) 50 MG tablet, Take half tablet at bed time., Disp: 30 tablet, Rfl: 0  No Known Allergies   Review of Systems:   Pertinent items are noted in the HPI. Otherwise, ROS is negative.  Vitals:   Vitals:   08/02/18 0936  BP: 114/78  Pulse: 85  Temp: 98.3 F (36.8 C)  TempSrc: Oral  SpO2: 97%  Weight: 174 lb 3.2 oz (79 kg)  Height: 5\' 2"  (1.575 m)     Body mass index is 31.86 kg/m.  Physical Exam:   Physical Exam  Constitutional: She appears well-nourished.  HENT:  Head: Normocephalic and atraumatic.  Eyes: Pupils are equal, round, and reactive to light. EOM are normal.  Neck: Normal range of motion. Neck supple.  Cardiovascular: Normal rate, regular rhythm, normal heart sounds and intact distal pulses.  Pulmonary/Chest: Effort normal.  Abdominal: Soft.  Skin: Skin is warm.  Psychiatric: She has a normal mood and affect. Her behavior is normal.  Nursing note and vitals reviewed.  Assessment and Plan:   Brocha was seen today for tachycardia.  Diagnoses and all orders for this visit:  Dyspepsia -     Lipase -     H. pylori antibody, IgG -     Ambulatory referral to Gastroenterology -     pantoprazole (PROTONIX) 40 MG tablet; Take 1  tablet (40 mg total) by mouth daily.  Elevated LFTs -     Comprehensive metabolic panel  PCOS (polycystic ovarian syndrome)  Prediabetes -     Hemoglobin A1c  Nonintractable episodic headache, unspecified headache type -     TSH -     meloxicam (MOBIC) 15 MG tablet; Take 1 tablet (15 mg total) by mouth daily.  Sleep disorder breathing -     Ambulatory referral to Sleep Studies  Other hyperlipidemia -     Lipid panel  Tachycardia -     CBC with Differential/Platelet  Amenorrhea -     B-HCG Quant   . Reviewed expectations re: course of current medical issues. . Discussed self-management of symptoms. . Outlined signs and symptoms indicating need for more acute intervention. . Patient verbalized  understanding and all questions were answered. Marland Kitchen Health Maintenance issues including appropriate healthy diet, exercise, and smoking avoidance were discussed with patient. . See orders for this visit as documented in the electronic medical record. . Patient received an After Visit Summary.  Helane Rima, DO [place] 08/02/2018

## 2018-08-08 NOTE — Progress Notes (Signed)
Stacey Morales is a 32 y.o. female is here for follow up.  History of Present Illness:   HPI: Recent labs: NEW DM, HLD, polycythemia, H. Pylori. Discussed with patient. See Assessment and Plan section for Problem Based Charting of issues discussed today.   Health Maintenance Due  Topic Date Due  . PNEUMOCOCCAL POLYSACCHARIDE VACCINE AGE 64-64 HIGH RISK  03/19/1990  . FOOT EXAM  03/19/1998  . OPHTHALMOLOGY EXAM  03/19/1998  . TETANUS/TDAP  03/20/2007  . INFLUENZA VACCINE  05/12/2018   Depression screen [facility] 2/9 07/02/2017 07/01/2017 09/26/2016  Decreased Interest '2 2 2  ' Down, Depressed, Hopeless '2 2 1  ' PHQ - 2 Score '4 4 3  ' Altered sleeping 0 3 1  Tired, decreased energy '2 3 3  ' Change in appetite '2 3 1  ' Feeling bad or failure about yourself  0 3 0  Trouble concentrating 0 3 0  Moving slowly or fidgety/restless 0 0 0  Suicidal thoughts 0 0 0  PHQ-9 Score '8 19 8  ' Difficult doing work/chores Somewhat difficult Somewhat difficult Not difficult at all   PMHx, SurgHx, SocialHx, FamHx, Medications, and Allergies were reviewed in the Visit Navigator and updated as appropriate.   Patient Active Problem List   Diagnosis Date Noted  . H. pylori infection 08/10/2018  . Newly diagnosed diabetes ([place]) 08/10/2018  . Hyperlipidemia associated with type 2 diabetes mellitus ([place]) 08/10/2018  . Polycythemia 08/10/2018  . Female infertility 09/12/2017  . PCOS (polycystic ovarian syndrome), with polycystic ovaries 09/12/2017  . Vitamin D deficiency 09/12/2017  . Class 1 obesity with serious comorbidity and body mass index (BMI) of 31.0 to 31.9 in adult 07/01/2017   Social History   Tobacco Use  . Smoking status: Former Smoker    Years: 14.00    Types: Cigarettes    Last attempt to quit: 02/11/2017    Years since quitting: 1.4  . Smokeless tobacco: Never Used  Substance Use Topics  . Alcohol use: Yes    Comment: occasionally   . Drug use: No   Current Medications and Allergies:   .   acetaminophen (TYLENOL) 500 MG tablet, Take 2 tablets (1,000 mg total) by mouth every 8 (eight) hours as needed., Disp: 60 tablet, Rfl: 0 .  albuterol (PROVENTIL HFA;VENTOLIN HFA) 108 (90 Base) MCG/ACT inhaler, Inhale 2 puffs into the lungs every 6 (six) hours as needed for wheezing or shortness of breath., Disp: 1 Inhaler, Rfl: 0 .  Cholecalciferol 1000 units tablet, Take 2 tablets (2,000 Units total) by mouth daily., Disp: 60 tablet, Rfl: 6 .  folic acid (FOLVITE) 098 MCG tablet, Take 400 mcg by mouth daily., Disp: , Rfl:  .  Magnesium 400 MG CAPS, Take 1 capsule by mouth at bedtime., Disp: 30 capsule, Rfl: 4 .  meloxicam (MOBIC) 15 MG tablet, Take 1 tablet (15 mg total) by mouth daily., Disp: 30 tablet, Rfl: 0 .  pantoprazole (PROTONIX) 40 MG tablet, Take 1 tablet (40 mg total) by mouth daily., Disp: 30 tablet, Rfl: 3 .  sertraline (ZOLOFT) 50 MG tablet, Take half tablet at bed time., Disp: 30 tablet, Rfl: 0  No Known Allergies   Review of Systems   Pertinent items are noted in the HPI. Otherwise, ROS is negative.  Vitals:   Vitals:   08/09/18 0701  BP: 106/70  Pulse: 73  Temp: 98.7 F (37.1 C)  TempSrc: Oral  SpO2: 97%  Weight: 173 lb 12.8 oz (78.8 kg)  Height: '5\' 2"'  (1.575 m)  Body mass index is 31.79 kg/m.  Physical Exam:   Physical Exam  Constitutional: She appears well-nourished.  HENT:  Head: Normocephalic and atraumatic.  Eyes: Pupils are equal, round, and reactive to light. EOM are normal.  Neck: Normal range of motion. Neck supple.  Cardiovascular: Normal rate, regular rhythm, normal heart sounds and intact distal pulses.  Pulmonary/Chest: Effort normal.  Abdominal: Soft.  Skin: Skin is warm.  Psychiatric: She has a normal mood and affect. Her behavior is normal.  Nursing note and vitals reviewed.  Results for orders placed or performed in visit on 08/02/18  CBC with Differential/Platelet  Result Value Ref Range   WBC 9.3 4.0 - 10.5 K/uL   RBC 5.15 (H)  3.87 - 5.11 Mil/uL   Hemoglobin 16.1 (H) 12.0 - 15.0 g/dL   HCT 46.8 (H) 36.0 - 46.0 %   MCV 90.9 78.0 - 100.0 fl   MCHC 34.4 30.0 - 36.0 g/dL   RDW 12.9 11.5 - 15.5 %   Platelets 262.0 150.0 - 400.0 K/uL   Neutrophils Relative % 64.9 43.0 - 77.0 %   Lymphocytes Relative 27.3 12.0 - 46.0 %   Monocytes Relative 5.0 3.0 - 12.0 %   Eosinophils Relative 2.0 0.0 - 5.0 %   Basophils Relative 0.8 0.0 - 3.0 %   Neutro Abs 6.0 1.4 - 7.7 K/uL   Lymphs Abs 2.5 0.7 - 4.0 K/uL   Monocytes Absolute 0.5 0.1 - 1.0 K/uL   Eosinophils Absolute 0.2 0.0 - 0.7 K/uL   Basophils Absolute 0.1 0.0 - 0.1 K/uL  Comprehensive metabolic panel  Result Value Ref Range   Sodium 139 135 - 145 mEq/L   Potassium 4.4 3.5 - 5.1 mEq/L   Chloride 103 96 - 112 mEq/L   CO2 29 19 - 32 mEq/L   Glucose, Bld 151 (H) 70 - 99 mg/dL   BUN 11 6 - 23 mg/dL   Creatinine, Ser 0.88 0.40 - 1.20 mg/dL   Total Bilirubin 0.5 0.2 - 1.2 mg/dL   Alkaline Phosphatase 71 39 - 117 U/L   AST 46 (H) 0 - 37 U/L   ALT 71 (H) 0 - 35 U/L   Total Protein 7.6 6.0 - 8.3 g/dL   Albumin 4.6 3.5 - 5.2 g/dL   Calcium 9.6 8.4 - 10.5 mg/dL   GFR 79.99 >60.00 mL/min  Hemoglobin A1c  Result Value Ref Range   Hgb A1c MFr Bld 6.9 (H) 4.6 - 6.5 %  Lipid panel  Result Value Ref Range   Cholesterol 223 (H) 0 - 200 mg/dL   Triglycerides 243.0 (H) 0.0 - 149.0 mg/dL   HDL 43.10 >39.00 mg/dL   VLDL 48.6 (H) 0.0 - 40.0 mg/dL   Total CHOL/HDL Ratio 5    NonHDL 179.82   TSH  Result Value Ref Range   TSH 1.21 0.35 - 4.50 uIU/mL  Lipase  Result Value Ref Range   Lipase 38.0 11.0 - 59.0 U/L  H. pylori antibody, IgG  Result Value Ref Range   H Pylori IgG Positive (A) Negative  B-HCG Quant  Result Value Ref Range   Quantitative HCG 0.06 mIU/ml  LDL cholesterol, direct  Result Value Ref Range   Direct LDL 149.0 mg/dL   Assessment and Plan:   Hyperlipidemia associated with type 2 diabetes mellitus (HCC) NEW.   Lipids:    Component Value Date/Time    CHOL 223 (H) 08/02/2018 0954   CHOL 211 (H) 07/01/2017 1125   TRIG 243.0 (H) 08/02/2018  0954   HDL 43.10 08/02/2018 0954   HDL 38 (L) 07/01/2017 1125   LDLDIRECT 149.0 08/02/2018 0954   VLDL 48.6 (H) 08/02/2018 0954   CHOLHDL 5 08/02/2018 0954   The patient is asked to make an attempt to improve diet and exercise patterns to aid in medical management of this problem.   Newly diagnosed diabetes ([place]) NEW.   Lab Results  Component Value Date   HGBA1C 6.9 (H) 08/02/2018   Recommendations: 1.  Patient is counseled on appropriate foot care. 2.  BP goal < 130/80. 3.  LDL goal of < 100, HDL > 40 and TG < 150.  4.  Eye Exam yearly and Dental Exam every 6 months. 5.  Dietary recommendations: < 100 g carbohydrates/day 6.  Physical Activity recommendations:  Aerobic and strength training 3-5 days per week. 7.  Influenza annually.   Diabetes Health Maintenance Due  Topic Date Due  . FOOT EXAM  03/19/1998  . OPHTHALMOLOGY EXAM  03/19/1998  . HEMOGLOBIN A1C  02/01/2019   Diabetes QM Metrics Latest Ref Rng & Units 08/02/2018  HbA1c 4.6 - 6.5 % 6.9(H)  LDL Direct mg/dL 149.0   BP& BMI  08/09/2018 08/02/2018 03/11/2018 01/24/2018 01/21/2018  BP 106/70 114/78 130/84 126/80 120/74  BMI 31.79 31.86 30.47 30.14 30.11    PCOS (polycystic ovarian syndrome), with polycystic ovaries Center of many of her issues. Will work on DM and weight loss, which will hopefully resolve other medical issues.   H. pylori infection New. H pylori treatment kit prescribed today. Will recheck in 4 weeks.   Polycythemia New. Does not appear to be concentrated. Will request records from fertility clinic to see if any previous medications induced this and to see if hypercoag work up was done.   Meds ordered this encounter  Medications  . amoxicillin-clarithromycin-lansoprazole (PREVPAC) combo pack    Sig: Take by mouth 2 (two) times daily. Follow package directions.    Dispense:  1 kit    Refill:  0  .  metFORMIN (GLUCOPHAGE XR) 750 MG 24 hr tablet    Sig: Take 1 tablet (750 mg total) by mouth daily with breakfast.    Dispense:  90 tablet    Refill:  2   . Reviewed expectations re: course of current medical issues. . Discussed self-management of symptoms. . Outlined signs and symptoms indicating need for more acute intervention. . Patient verbalized understanding and all questions were answered. Marland Kitchen Health Maintenance issues including appropriate healthy diet, exercise, and smoking avoidance were discussed with patient. . See orders for this visit as documented in the electronic medical record. . Patient received an After Visit Summary.  Briscoe Deutscher, DO [place] 08/10/2018

## 2018-08-09 ENCOUNTER — Ambulatory Visit: Payer: BLUE CROSS/BLUE SHIELD | Admitting: Family Medicine

## 2018-08-09 VITALS — BP 106/70 | HR 73 | Temp 98.7°F | Ht 62.0 in | Wt 173.8 lb

## 2018-08-09 DIAGNOSIS — E119 Type 2 diabetes mellitus without complications: Secondary | ICD-10-CM

## 2018-08-09 DIAGNOSIS — E1169 Type 2 diabetes mellitus with other specified complication: Secondary | ICD-10-CM

## 2018-08-09 DIAGNOSIS — E6609 Other obesity due to excess calories: Secondary | ICD-10-CM | POA: Diagnosis not present

## 2018-08-09 DIAGNOSIS — Z6831 Body mass index (BMI) 31.0-31.9, adult: Secondary | ICD-10-CM

## 2018-08-09 DIAGNOSIS — E785 Hyperlipidemia, unspecified: Secondary | ICD-10-CM

## 2018-08-09 DIAGNOSIS — E282 Polycystic ovarian syndrome: Secondary | ICD-10-CM

## 2018-08-09 DIAGNOSIS — I10 Essential (primary) hypertension: Secondary | ICD-10-CM

## 2018-08-09 DIAGNOSIS — E1159 Type 2 diabetes mellitus with other circulatory complications: Secondary | ICD-10-CM

## 2018-08-09 DIAGNOSIS — A048 Other specified bacterial intestinal infections: Secondary | ICD-10-CM

## 2018-08-09 DIAGNOSIS — I152 Hypertension secondary to endocrine disorders: Secondary | ICD-10-CM

## 2018-08-09 DIAGNOSIS — D751 Secondary polycythemia: Secondary | ICD-10-CM

## 2018-08-09 MED ORDER — AMOXICILL-CLARITHRO-LANSOPRAZ PO MISC: Freq: Two times a day (BID) | ORAL | 0 refills | Status: DC

## 2018-08-09 MED ORDER — METFORMIN HCL ER 750 MG PO TB24: 750.0000 mg | ORAL_TABLET | Freq: Every day | ORAL | 2 refills | Status: DC

## 2018-08-09 NOTE — Patient Instructions (Signed)
STOP THE PROTONIX AND ZANTAC. TAKE THE AMOX/CLARITHRO/OMEPRAZOLE KIT PRESCRIBED X 2 WEEKS. AFTER YOU FINISH THE STOMACH MEDICINES, START THE METFORMIN AGAIN.  AFTER YOU FEEL USED TO THE METFORMIN, ADD THE INJECTION ([place]).  I AM ORDERING A SLEEP STUDY. FOLLOW UP IN 3 MONTHS. CALL SOONER IF ANY ISSUES.. WE WILL REQUEST RECORDS FROM YOUR FERTILITY DOCTOR.

## 2018-08-10 ENCOUNTER — Encounter: Payer: Self-pay | Admitting: Family Medicine

## 2018-08-10 DIAGNOSIS — A048 Other specified bacterial intestinal infections: Secondary | ICD-10-CM | POA: Insufficient documentation

## 2018-08-10 DIAGNOSIS — D751 Secondary polycythemia: Secondary | ICD-10-CM | POA: Insufficient documentation

## 2018-08-10 DIAGNOSIS — E1169 Type 2 diabetes mellitus with other specified complication: Secondary | ICD-10-CM | POA: Insufficient documentation

## 2018-08-10 DIAGNOSIS — E785 Hyperlipidemia, unspecified: Secondary | ICD-10-CM

## 2018-08-10 NOTE — Assessment & Plan Note (Signed)
NEW.   Lab Results  Component Value Date   HGBA1C 6.9 (H) 08/02/2018   Recommendations: 1.  Patient is counseled on appropriate foot care. 2.  BP goal < 130/80. 3.  LDL goal of < 100, HDL > 40 and TG < 150.  4.  Eye Exam yearly and Dental Exam every 6 months. 5.  Dietary recommendations: < 100 g carbohydrates/day 6.  Physical Activity recommendations:  Aerobic and strength training 3-5 days per week. 7.  Influenza annually.   Diabetes Health Maintenance Due  Topic Date Due  . FOOT EXAM  03/19/1998  . OPHTHALMOLOGY EXAM  03/19/1998  . HEMOGLOBIN A1C  02/01/2019   Diabetes QM Metrics Latest Ref Rng & Units 08/02/2018  HbA1c 4.6 - 6.5 % 6.9(H)  LDL Direct mg/dL 161.0   BP& BMI  96/01/5408 08/02/2018 03/11/2018 01/24/2018 01/21/2018  BP 106/70 114/78 130/84 126/80 120/74  BMI 31.79 31.86 30.47 30.14 30.11

## 2018-08-10 NOTE — Assessment & Plan Note (Signed)
Center of many of her issues. Will work on DM and weight loss, which will hopefully resolve other medical issues.

## 2018-08-10 NOTE — Assessment & Plan Note (Signed)
New. H pylori treatment kit prescribed today. Will recheck in 4 weeks.

## 2018-08-10 NOTE — Assessment & Plan Note (Signed)
New. Does not appear to be concentrated. Will request records from fertility clinic to see if any previous medications induced this and to see if hypercoag work up was done.

## 2018-08-10 NOTE — Assessment & Plan Note (Signed)
NEW.   Lipids:    Component Value Date/Time   CHOL 223 (H) 08/02/2018 0954   CHOL 211 (H) 07/01/2017 1125   TRIG 243.0 (H) 08/02/2018 0954   HDL 43.10 08/02/2018 0954   HDL 38 (L) 07/01/2017 1125   LDLDIRECT 149.0 08/02/2018 0954   VLDL 48.6 (H) 08/02/2018 0954   CHOLHDL 5 08/02/2018 0954   The patient is asked to make an attempt to improve diet and exercise patterns to aid in medical management of this problem.

## 2018-09-06 ENCOUNTER — Encounter: Payer: Self-pay | Admitting: Family Medicine

## 2018-09-12 ENCOUNTER — Encounter: Payer: Self-pay | Admitting: Gastroenterology

## 2018-09-16 ENCOUNTER — Telehealth: Payer: Self-pay | Admitting: Family Medicine

## 2018-09-16 NOTE — Telephone Encounter (Signed)
Copied from CRM [phone]#[id number]. Topic: Quick Communication - See Telephone Encounter >> Sep 16, 2018 10:06 AM Windy KalataMichael, Advith Martine L, NT wrote: CRM for notification. See Telephone encounter for: 09/16/18.  Patient is calling and states that she was told to get a Trulicity injection every 3 months and she went to the pharmacy today and they do not have a script for her. Please advise.  [id number] [address]- [place], [place] - [phone] AT [facility] [address] [place] [id number] Phone: [phone] Fax: [phone]

## 2018-09-16 NOTE — Telephone Encounter (Signed)
See note

## 2018-09-16 NOTE — Telephone Encounter (Signed)
Please advise 

## 2018-09-18 NOTE — Telephone Encounter (Signed)
Please refill and make sure that she has an upcoming appointment.

## 2018-09-19 ENCOUNTER — Other Ambulatory Visit: Payer: Self-pay

## 2018-09-19 MED ORDER — DULAGLUTIDE 1.5 MG/0.5ML ~~LOC~~ SOAJ: 1.0000 "pen " | SUBCUTANEOUS | 1 refills | Status: DC

## 2018-09-19 NOTE — Telephone Encounter (Signed)
Pt called to follow up about refill for Trulicity. Has OV scheduled for 09/28/18 Please advise pt. CB# [phone]

## 2018-09-19 NOTE — Telephone Encounter (Signed)
Called patient script called in

## 2018-09-20 NOTE — Telephone Encounter (Signed)
Done

## 2018-09-28 ENCOUNTER — Encounter: Payer: Self-pay | Admitting: Family Medicine

## 2018-09-28 ENCOUNTER — Ambulatory Visit: Payer: BLUE CROSS/BLUE SHIELD | Admitting: Family Medicine

## 2018-09-28 VITALS — BP 118/70 | HR 100 | Temp 98.8°F | Ht 62.0 in | Wt 169.8 lb

## 2018-09-28 DIAGNOSIS — J4520 Mild intermittent asthma, uncomplicated: Secondary | ICD-10-CM

## 2018-09-28 DIAGNOSIS — G43009 Migraine without aura, not intractable, without status migrainosus: Secondary | ICD-10-CM | POA: Diagnosis not present

## 2018-09-28 DIAGNOSIS — R1013 Epigastric pain: Secondary | ICD-10-CM | POA: Insufficient documentation

## 2018-09-28 DIAGNOSIS — E282 Polycystic ovarian syndrome: Secondary | ICD-10-CM | POA: Diagnosis not present

## 2018-09-28 DIAGNOSIS — Z6831 Body mass index (BMI) 31.0-31.9, adult: Secondary | ICD-10-CM

## 2018-09-28 DIAGNOSIS — J45909 Unspecified asthma, uncomplicated: Secondary | ICD-10-CM | POA: Insufficient documentation

## 2018-09-28 DIAGNOSIS — R519 Headache, unspecified: Secondary | ICD-10-CM | POA: Insufficient documentation

## 2018-09-28 DIAGNOSIS — D751 Secondary polycythemia: Secondary | ICD-10-CM | POA: Diagnosis not present

## 2018-09-28 DIAGNOSIS — Z23 Encounter for immunization: Secondary | ICD-10-CM | POA: Diagnosis not present

## 2018-09-28 DIAGNOSIS — N979 Female infertility, unspecified: Secondary | ICD-10-CM

## 2018-09-28 DIAGNOSIS — E1169 Type 2 diabetes mellitus with other specified complication: Secondary | ICD-10-CM | POA: Diagnosis not present

## 2018-09-28 DIAGNOSIS — R51 Headache: Secondary | ICD-10-CM | POA: Diagnosis not present

## 2018-09-28 DIAGNOSIS — E6609 Other obesity due to excess calories: Secondary | ICD-10-CM

## 2018-09-28 DIAGNOSIS — F4323 Adjustment disorder with mixed anxiety and depressed mood: Secondary | ICD-10-CM

## 2018-09-28 DIAGNOSIS — E785 Hyperlipidemia, unspecified: Secondary | ICD-10-CM | POA: Diagnosis not present

## 2018-09-28 DIAGNOSIS — A048 Other specified bacterial intestinal infections: Secondary | ICD-10-CM

## 2018-09-28 LAB — COMPREHENSIVE METABOLIC PANEL
ALT: 85 U/L — ABNORMAL HIGH (ref 0–35)
AST: 48 U/L — ABNORMAL HIGH (ref 0–37)
Albumin: 4.7 g/dL (ref 3.5–5.2)
Alkaline Phosphatase: 72 U/L (ref 39–117)
BUN: 12 mg/dL (ref 6–23)
CO2: 28 mEq/L (ref 19–32)
Calcium: 10.4 mg/dL (ref 8.4–10.5)
Chloride: 104 mEq/L (ref 96–112)
Creatinine, Ser: 0.89 mg/dL (ref 0.40–1.20)
GFR: 78.87 mL/min (ref >60.00)
Glucose, Bld: 89 mg/dL (ref 70–99)
Potassium: 4.6 mEq/L (ref 3.5–5.1)
Sodium: 140 mEq/L (ref 135–145)
Total Bilirubin: 0.6 mg/dL (ref 0.2–1.2)
Total Protein: 7.6 g/dL (ref 6.0–8.3)

## 2018-09-28 LAB — CBC WITH DIFFERENTIAL/PLATELET
Basophils Absolute: 0.1 10*3/uL (ref 0.0–0.1)
Basophils Relative: 1 % (ref 0.0–3.0)
Eosinophils Absolute: 0.4 10*3/uL (ref 0.0–0.7)
Eosinophils Relative: 4.3 % (ref 0.0–5.0)
HCT: 45.6 % (ref 36.0–46.0)
Hemoglobin: 15.8 g/dL — ABNORMAL HIGH (ref 12.0–15.0)
Lymphocytes Relative: 30.1 % (ref 12.0–46.0)
Lymphs Abs: 2.9 10*3/uL (ref 0.7–4.0)
MCHC: 34.7 g/dL (ref 30.0–36.0)
MCV: 92 fl (ref 78.0–100.0)
Monocytes Absolute: 0.6 10*3/uL (ref 0.1–1.0)
Monocytes Relative: 5.7 % (ref 3.0–12.0)
Neutro Abs: 5.7 10*3/uL (ref 1.4–7.7)
Neutrophils Relative %: 58.9 % (ref 43.0–77.0)
Platelets: 284 10*3/uL (ref 150.0–400.0)
RBC: 4.96 Mil/uL (ref 3.87–5.11)
RDW: 13.2 % (ref 11.5–15.5)
WBC: 9.7 10*3/uL (ref 4.0–10.5)

## 2018-09-28 LAB — MICROALBUMIN / CREATININE URINE RATIO
Creatinine,U: 182.3 mg/dL
Microalb Creat Ratio: 0.4 mg/g (ref 0.0–30.0)
Microalb, Ur: 0.8 mg/dL (ref 0.0–1.9)

## 2018-09-28 MED ORDER — MAGNESIUM 400 MG PO CAPS: 1.0000 | ORAL_CAPSULE | Freq: Every day | ORAL | 4 refills | Status: DC

## 2018-09-28 MED ORDER — SERTRALINE HCL 50 MG PO TABS: ORAL_TABLET | ORAL | 0 refills | Status: DC

## 2018-09-28 MED ORDER — DULAGLUTIDE 1.5 MG/0.5ML ~~LOC~~ SOAJ: 1.0000 "pen " | SUBCUTANEOUS | 1 refills | Status: DC

## 2018-09-28 MED ORDER — ACETAMINOPHEN 500 MG PO TABS: 1000.0000 mg | ORAL_TABLET | Freq: Three times a day (TID) | ORAL | 0 refills | Status: DC | PRN

## 2018-09-28 MED ORDER — MELOXICAM 15 MG PO TABS: 15.0000 mg | ORAL_TABLET | Freq: Every day | ORAL | 0 refills | Status: DC

## 2018-09-28 MED ORDER — FLUTICASONE PROPIONATE HFA 110 MCG/ACT IN AERO: 2.0000 | INHALATION_SPRAY | Freq: Two times a day (BID) | RESPIRATORY_TRACT | 12 refills | Status: DC

## 2018-09-28 MED ORDER — FOLIC ACID 400 MCG PO TABS: 400.0000 ug | ORAL_TABLET | Freq: Every day | ORAL | 0 refills | Status: DC

## 2018-09-28 MED ORDER — ALBUTEROL SULFATE HFA 108 (90 BASE) MCG/ACT IN AERS: 2.0000 | INHALATION_SPRAY | Freq: Four times a day (QID) | RESPIRATORY_TRACT | 0 refills | Status: DC | PRN

## 2018-09-28 NOTE — Progress Notes (Signed)
Stacey CadetLaura Morales is a 32 y.o. female is here for follow up.  History of Present Illness:   HPI: Doing well. Took H.pylori treatment and felt improved. Stopped treatment (including the Protonix) and is having some heartburn again, mostly at night. Appointment with GI next week. Gastrointestinal ROS: positive for - abdominal pain, blood in stools, diarrhea, gas/bloating, hematemesis, melena and swallowing difficulty/pain. Still taking Metformin and Trulicity. Down 5 pounds. Tolerating medications, mild nausea at times, especially if she eats something very fatty. Has noted constipation. Tried OTC pills, did not help. Not taking Zoloft. Working on exercise and stress reduction. Feels happier since holding on fertility treatments. Plan to restart in the Spring. She understands that she will need to stop the Trulicity.  Health Maintenance Due  Topic Date Due  . PNEUMOCOCCAL POLYSACCHARIDE VACCINE AGE 70-64 HIGH RISK  03/19/1990  . OPHTHALMOLOGY EXAM  03/19/1998  . URINE MICROALBUMIN  03/19/1998  . TETANUS/TDAP  03/20/2007  . INFLUENZA VACCINE  05/12/2018   [facility] 09/28/2018 07/02/2017 07/01/2017  Decreased Interest 0 2 2  Down, Depressed, Hopeless 0 2 2  PHQ - 2 Score 0 4 4  Altered sleeping 0 0 3  Tired, decreased energy 1 2 3   Change in appetite 0 2 3  Feeling bad or failure about yourself  0 0 3  Trouble concentrating 0 0 3  Moving slowly or fidgety/restless 0 0 0  Suicidal thoughts 0 0 0  PHQ-9 Score 1 8 19   Difficult doing work/chores Not difficult at all Somewhat difficult Somewhat difficult   PMHx, SurgHx, SocialHx, FamHx, Medications, and Allergies were reviewed in the Visit Navigator and updated as appropriate.   Patient Active Problem List   Diagnosis Date Noted  . Migraine without aura and without status migrainosus, not intractable 09/28/2018  . Reactive airway disease 09/28/2018  . Nonintractable episodic headache 09/28/2018  . Dyspepsia 09/28/2018  .  Situational mixed anxiety and depressive disorder 09/28/2018  . H. pylori infection 08/10/2018  . Type 2 diabetes mellitus with other specified complication (HCC) 08/10/2018  . Hyperlipidemia associated with type 2 diabetes mellitus (HCC) 08/10/2018  . Polycythemia 08/10/2018  . Female infertility 09/12/2017  . PCOS (polycystic ovarian syndrome), with polycystic ovaries 09/12/2017  . Vitamin D deficiency 09/12/2017  . Class 1 obesity with serious comorbidity and body mass index (BMI) of 31.0 to 31.9 in adult 07/01/2017   Social History   Tobacco Use  . Smoking status: Former Smoker    Years: 14.00    Types: Cigarettes    Last attempt to quit: 02/11/2017    Years since quitting: 1.6  . Smokeless tobacco: Never Used  Substance Use Topics  . Alcohol use: Yes    Comment: occasionally   . Drug use: No   Current Medications and Allergies:   .  metFORMIN (GLUCOPHAGE XR) 750 MG 24 hr tablet, Take 1 tablet (750 mg total) by mouth daily with breakfast., Disp: 90 tablet, Rfl: 2 .  albuterol (PROVENTIL HFA;VENTOLIN HFA) 108 (90 Base) MCG/ACT inhaler, Inhale 2 puffs into the lungs every 6 (six) hours as needed for wheezing or shortness of breath., Disp: 1 Inhaler, Rfl: 0 .  Dulaglutide (TRULICITY) 1.5 MG/0.5ML SOPN, Inject 1 pen into the skin once a week., Disp: 4 pen, Rfl: 1  No Known Allergies   Review of Systems   Pertinent items are noted in the HPI. Otherwise, a complete ROS is negative.  Vitals:   Vitals:   09/28/18 0906  BP: 118/70  Pulse: 100  Temp: 98.8 F (37.1 C)  TempSrc: Oral  SpO2: 97%  Weight: 169 lb 12.8 oz (77 kg)  Height: 5\' 2"  (1.575 m)     Body mass index is 31.06 kg/m.  Physical Exam:   Physical Exam Vitals signs and nursing note reviewed.  HENT:     Head: Normocephalic and atraumatic.  Eyes:     Pupils: Pupils are equal, round, and reactive to light.  Neck:     Musculoskeletal: Normal range of motion and neck supple.  Cardiovascular:     Rate and  Rhythm: Normal rate and regular rhythm.     Heart sounds: Normal heart sounds.  Pulmonary:     Effort: Pulmonary effort is normal.     Breath sounds: Normal air entry. Wheezing present.  Abdominal:     Palpations: Abdomen is soft.  Skin:    General: Skin is warm.  Psychiatric:        Behavior: Behavior normal.    Assessment and Plan:   Aundraya was seen today for medication refill.  Diagnoses and all orders for this visit:  Type 2 diabetes mellitus with other specified complication, without long-term current use of insulin (HCC) Comments: Tolerating Metformin and Trulicity. Will recheck in 3 months. Too early for A1c.  Orders: -     Discontinue: Dulaglutide (TRULICITY) 1.5 MG/0.5ML SOPN; Inject 1 pen into the skin once a week. -     Comprehensive metabolic panel -     Microalbumin / creatinine urine ratio -     Dulaglutide (TRULICITY) 1.5 MG/0.5ML SOPN; Inject 1 pen into the skin once a week.  Migraine without aura and without status migrainosus, not intractable Comments: Reviewed the importance of stress reduction and stretching. Orders: -     Discontinue: acetaminophen (TYLENOL) 500 MG tablet; Take 2 tablets (1,000 mg total) by mouth every 8 (eight) hours as needed. -     Magnesium 400 MG CAPS; Take 1 capsule by mouth at bedtime.  Mild intermittent reactive airway disease without complication Comments: Wheeze on exam today. No Hx of asthma, but uses albuterol prn "throat closing feeling." Possible GERD contribution. Will likely need future PFTs. See orders.  Orders: -     albuterol (PROVENTIL HFA;VENTOLIN HFA) 108 (90 Base) MCG/ACT inhaler; Inhale 2 puffs into the lungs every 6 (six) hours as needed for wheezing or shortness of breath. -     fluticasone (FLOVENT HFA) 110 MCG/ACT inhaler; Inhale 2 puffs into the lungs 2 (two) times daily.  Situational mixed anxiety and depressive disorder Comments: Improved today. Exercising. Working on stress reduction. Reviewed Zoloft as a  future option if needed. Orders: -     Discontinue: sertraline (ZOLOFT) 50 MG tablet; Take half tablet at bed time.  Hyperlipidemia associated with type 2 diabetes mellitus (HCC) -     Comprehensive metabolic panel  PCOS (polycystic ovarian syndrome), with polycystic ovaries -     Comprehensive metabolic panel  Class 1 obesity due to excess calories with serious comorbidity and body mass index (BMI) of 31.0 to 31.9 in adult The patient is asked to make an attempt to improve diet and exercise patterns to aid in medical management of this problem.   H. pylori infection Comments: s/p treatment. With continue dyspepsia. Okay Zantac this week. No PPI until she see GI next week, in case they want to retest.   Polycythemia -     CBC with Differential/Platelet  Female infertility Comments: Will start treatments in the Spring 2020. Will  focus on her health for now.  Orders: -     folic acid (FOLVITE) 400 MCG tablet; Take 1 tablet (400 mcg total) by mouth daily.  Need for Tdap vaccination -     Tdap vaccine greater than or equal to 7yo IM  . Orders and follow up as documented in EpicCare, reviewed diet, exercise and weight control, cardiovascular risk and specific lipid/LDL goals reviewed, reviewed medications and side effects in detail.  . Reviewed expectations re: course of current medical issues. . Outlined signs and symptoms indicating need for more acute intervention. . Patient verbalized understanding and all questions were answered. . Patient received an After Visit Summary.  CMA served as Neurosurgeon during this visit. History, Physical, and Plan performed by medical provider. The above documentation has been reviewed and is accurate and complete. Helane Rima, D.O.  Helane Rima, DO [place] [place] [facility] 09/28/2018

## 2018-09-28 NOTE — Patient Instructions (Addendum)
Health Maintenance Due  Topic Date Due      . OPHTHALMOLOGY EXAM  You need to have a diabetic eye exam  03/19/1998      . TETANUS/TDAP  03/20/2007  . INFLUENZA VACCINE  Done in office today  05/12/2018      .Marland KitchenIt takes about 2 weeks for protection to develop after vaccination.  There are many flu viruses, and they are always changing. Each year a new flu vaccine is made to protect against three or four viruses that are likely to cause disease in the upcoming flu season. Even when the vaccine doesn't exactly match these viruses, it may still provide some protection.   Influenza vaccine does not cause flu.  Influenza vaccine may be given at the same time as other vaccines.  3. Talk with your health care provider  Tell your vaccine provider if the person getting the vaccine: ; Has had an allergic reaction after a previous dose of influenza vaccine, or has any severe, life-threatening allergies.  ; Has ever had Guillain-Barr Syndrome (also called GBS).  In some cases, your health care provider may decide to postpone influenza vaccination to a future visit.  People with minor illnesses, such as a cold, may be vaccinated. People who are moderately or severely ill should usually wait until they recover before getting influenza vaccine.  Your health care provider can give you more information.  4. Risks of a reaction  ; Soreness, redness, and swelling where shot is given, fever, muscle aches, and headache can happen after influenza vaccine. ; There may be a very small increased risk of Guillain-Barr Syndrome (GBS) after inactivated influenza vaccine (the flu shot).  Young children who get the flu shot along with pneumococcal vaccine (PCV13), and/or DTaP vaccine at the same time might be slightly more likely to have a seizure caused by fever. Tell your health care provider if a child who is getting flu vaccine has ever had a seizure.  People sometimes faint after medical procedures,  including vaccination. Tell your provider if you feel dizzy or have vision changes or ringing in the ears.  As with any medicine, there is a very remote chance of a vaccine causing a severe allergic reaction, other serious injury, or death.  5. What if there is a serious problem?  An allergic reaction could occur after the vaccinated person leaves the clinic. If you see signs of a severe allergic reaction (hives, swelling of the face and throat, difficulty breathing, a fast heartbeat, dizziness, or weakness), call 9-1-1 and get the person to the nearest hospital.  For other signs that concern you, call your health care provider.  Adverse reactions should be reported to the Vaccine Adverse Event Reporting System (VAERS). Your health care provider will usually file this report, or you can do it yourself. Visit the VAERS website at www.vaers.LAgents.no or call (980) 857-8154.  VAERS is only for reporting reactions, and VAERS staff do not give medical advice.  6. The National Vaccine Injury Compensation Program  The [employer] Vaccine Injury Compensation Program (VICP) is a federal program that was created to compensate people who may have been injured by certain vaccines. Visit the VICP website at SpiritualWord.at or call 8171151010 to learn about the program and about filing a claim. There is a time limit to file a claim for compensation.  7. How can I learn more?  ; Ask your health care provider.  ; Call your local or state health department. ; Contact the Centers for Disease  Control and Prevention (CDC): - Call 250-777-92321-718-370-6292 (1-800-CDC-INFO) or - Visit CDC's influenza website at BiotechRoom.com.cywww.cdc.gov/flu  Vaccine Information Statement (Interim) Inactivated Influenza Vaccine  05/26/2018 42 U.S.C.  300aa-26   Department of Health and Advertising copywriterHuman Services Centers for Disease Control and Prevention  Office Use Only  GETTING TO GOOD BOWEL HEALTH  Irregular bowel habits such as  constipation can lead to many problems over time.  Having one soft bowel movement a day is the most important way to prevent further problems.    The goal: ONE SOFT BOWEL MOVEMENT A DAY!  To have soft, regular bowel movements:  . Drink at least 8 tall glasses of water a day.   . Take plenty of fiber.  Fiber is the undigested part of plant food that passes into the colon, acting s "natures broom" to encourage bowel motility and movement.  Fiber can absorb and hold large amounts of water. This results in a larger, bulkier stool, which is soft and easier to pass. Work gradually over several weeks up to 6 servings a day of fiber (25g a day even more if needed) in the form of: o Vegetables -- Root (potatoes, carrots, turnips), leafy green (lettuce, salad greens, celery, spinach), or cooked high residue (cabbage, broccoli, etc) o Fruit -- Fresh (unpeeled skin & pulp), Dried (prunes, apricots, cherries, etc ),  or stewed ( applesauce)  o Whole grain breads, pasta, etc (whole wheat)  o Bran cereals  . Bulking Agents -- This type of water-retaining fiber generally is easily obtained each day by one of the following:  o Psyllium bran -- The psyllium plant is remarkable because its ground seeds can retain so much water. This product is available as Metamucil, Konsyl, Effersyllium, Per Diem Fiber, or the less expensive generic preparation in drug and health food stores. Although labeled a laxative, it really is not a laxative.  o Methylcellulose -- This is another fiber derived from wood which also retains water. It is available as Citrucel. o Polyethylene Glycol - and "artificial" fiber commonly called Miralax or Glycolax.  It is helpful for people with gassy or bloated feelings with regular fiber o Flax Seed - a less gassy fiber than psyllium . No reading or other relaxing activity while on the toilet. If bowel movements take longer than 5 minutes, you are too constipated. . AVOID CONSTIPATION.  High fiber and  water intake usually takes care of this.  Sometimes a laxative is needed to stimulate more frequent bowel movements, but  . Laxatives are not a good long-term solution as it can wear the colon out. o Osmotics (Milk of Magnesia, Fleets phosphosoda, Magnesium citrate, MiraLax, GoLytely) are safer than  o Stimulants (Senokot, Castor Oil, Dulcolax, Ex Lax)    o Do not take laxatives for more than 7days in a row. .  IF SEVERELY CONSTIPATED, try a Bowel Retraining Program: o Do not use laxatives.  o Eat a diet high in roughage, such as bran cereals and leafy vegetables.  o Drink six (6) ounces of prune or apricot juice each morning.  o Eat two (2) large servings of stewed fruit each day.  o Take one (1) heaping tablespoon of a psyllium-based bulking agent twice a day. Use sugar-free sweetener when possible to avoid excessive calories.  o Eat a normal breakfast.  o Set aside 15 minutes after breakfast to sit on the toilet, but do not strain to have a bowel movement.  o If you do not have a bowel movement by  the third day, use an enema and repeat the above steps.

## 2018-09-29 ENCOUNTER — Encounter: Payer: Self-pay | Admitting: Family Medicine

## 2018-09-30 ENCOUNTER — Ambulatory Visit: Payer: BLUE CROSS/BLUE SHIELD | Admitting: Gastroenterology

## 2018-10-04 ENCOUNTER — Other Ambulatory Visit (INDEPENDENT_AMBULATORY_CARE_PROVIDER_SITE_OTHER): Payer: BLUE CROSS/BLUE SHIELD

## 2018-10-04 ENCOUNTER — Encounter: Payer: Self-pay | Admitting: Gastroenterology

## 2018-10-04 ENCOUNTER — Ambulatory Visit: Payer: BLUE CROSS/BLUE SHIELD | Admitting: Gastroenterology

## 2018-10-04 VITALS — BP 110/70 | HR 88 | Ht 61.0 in | Wt 167.5 lb

## 2018-10-04 DIAGNOSIS — R7989 Other specified abnormal findings of blood chemistry: Secondary | ICD-10-CM

## 2018-10-04 DIAGNOSIS — K219 Gastro-esophageal reflux disease without esophagitis: Secondary | ICD-10-CM | POA: Diagnosis not present

## 2018-10-04 DIAGNOSIS — R945 Abnormal results of liver function studies: Secondary | ICD-10-CM

## 2018-10-04 LAB — CBC WITH DIFFERENTIAL/PLATELET
BASOS PCT: 1 % (ref 0.0–3.0)
Basophils Absolute: 0.1 10*3/uL (ref 0.0–0.1)
EOS PCT: 8.9 % — AB (ref 0.0–5.0)
Eosinophils Absolute: 0.8 10*3/uL — ABNORMAL HIGH (ref 0.0–0.7)
HCT: 47 % — ABNORMAL HIGH (ref 36.0–46.0)
Hemoglobin: 16.1 g/dL — ABNORMAL HIGH (ref 12.0–15.0)
Lymphocytes Relative: 28.4 % (ref 12.0–46.0)
Lymphs Abs: 2.4 10*3/uL (ref 0.7–4.0)
MCHC: 34.3 g/dL (ref 30.0–36.0)
MCV: 90.9 fl (ref 78.0–100.0)
Monocytes Absolute: 0.5 10*3/uL (ref 0.1–1.0)
Monocytes Relative: 6 % (ref 3.0–12.0)
NEUTROS PCT: 55.7 % (ref 43.0–77.0)
Neutro Abs: 4.8 10*3/uL (ref 1.4–7.7)
Platelets: 258 10*3/uL (ref 150.0–400.0)
RBC: 5.16 Mil/uL — ABNORMAL HIGH (ref 3.87–5.11)
RDW: 12.9 % (ref 11.5–15.5)
WBC: 8.5 10*3/uL (ref 4.0–10.5)

## 2018-10-04 LAB — COMPREHENSIVE METABOLIC PANEL
ALK PHOS: 68 U/L (ref 39–117)
ALT: 94 U/L — ABNORMAL HIGH (ref 0–35)
AST: 61 U/L — ABNORMAL HIGH (ref 0–37)
Albumin: 4.8 g/dL (ref 3.5–5.2)
BUN: 14 mg/dL (ref 6–23)
CO2: 23 mEq/L (ref 19–32)
Calcium: 9.3 mg/dL (ref 8.4–10.5)
Chloride: 104 mEq/L (ref 96–112)
Creatinine, Ser: 0.86 mg/dL (ref 0.40–1.20)
GFR: 82.05 mL/min (ref >60.00)
Glucose, Bld: 85 mg/dL (ref 70–99)
POTASSIUM: 4 meq/L (ref 3.5–5.1)
SODIUM: 137 meq/L (ref 135–145)
Total Bilirubin: 0.7 mg/dL (ref 0.2–1.2)
Total Protein: 8.1 g/dL (ref 6.0–8.3)

## 2018-10-04 LAB — IBC PANEL
IRON: 81 ug/dL (ref 42–145)
Saturation Ratios: 22.5 % (ref 20.0–50.0)
Transferrin: 257 mg/dL (ref 212.0–360.0)

## 2018-10-04 LAB — PROTIME-INR
INR: 1.1 ratio — ABNORMAL HIGH (ref 0.8–1.0)
Prothrombin Time: 13 s (ref 9.6–13.1)

## 2018-10-04 LAB — FERRITIN: Ferritin: 91.7 ng/mL (ref 10.0–291.0)

## 2018-10-04 LAB — IGA: IgA: 333 mg/dL (ref 68–378)

## 2018-10-04 NOTE — Progress Notes (Signed)
HPI: This is a very pleasant 32 year old woman who was referred to me by Helane RimaWallace, Erica, DO  to evaluate epigastric burning.    Chief complaint is epigastric burning  Burning in epigastrium, especially while laying down. Tried sleeping on her left side but this did not help.  She has tried Tums in the past and it helped slightly.  She was recently found to be H. pylori serology positive and was put on Prevpac type therapy.  She said during this therapy her symptoms significantly improved but 2 days after stopping the therapy her symptoms returned.  She does not have dysphasia.  She has been gaining weight..  Currently not taking any antiacid medicines even though Zantac is on her medicine list  We spoke about her elevated transaminases.  She was not aware of this at all.  She has never been told that she had hepatitis or jaundice.  She does not have a family history of hepatitis or jaundice or liver disease.  She does not drink much alcohol.  She has been gaining weight lately.   Old Data Reviewed:  Blood work December 2019; normal CBC, normal complete metabolic profile except for slightly elevated transaminases (these have been elevated for at least 2 years by review in epic)  Blood work October 2019 lipase was normal, H pylori IgG + (too prev pac type treatment).    Abdominal ultrasound right upper quadrant December 2017 was normal except for fatty liver and mild right hydronephrosis    Review of systems: Pertinent positive and negative review of systems were noted in the above HPI section. All other review negative.   Past Medical History:  Diagnosis Date  . Diabetes (HCC)   . GERD (gastroesophageal reflux disease)   . HLD (hyperlipidemia)   . Kidney stone     Past Surgical History:  Procedure Laterality Date  . ARM SURGERY Right     Current Outpatient Medications  Medication Sig Dispense Refill  . albuterol (PROVENTIL HFA;VENTOLIN HFA) 108 (90 Base) MCG/ACT inhaler  Inhale 2 puffs into the lungs every 6 (six) hours as needed for wheezing or shortness of breath. 1 Inhaler 0  . Dulaglutide (TRULICITY) 1.5 MG/0.5ML SOPN Inject 1 pen into the skin once a week. 4 pen 1  . fluticasone (FLOVENT HFA) 110 MCG/ACT inhaler Inhale 2 puffs into the lungs 2 (two) times daily. 1 Inhaler 12  . Magnesium 400 MG CAPS Take 1 capsule by mouth at bedtime. 30 capsule 4  . metFORMIN (GLUCOPHAGE XR) 750 MG 24 hr tablet Take 1 tablet (750 mg total) by mouth daily with breakfast. 90 tablet 2  . ranitidine (ZANTAC) 150 MG tablet Take 1 tablet (150 mg total) by mouth 2 (two) times daily. 30 tablet 0  . folic acid (FOLVITE) 400 MCG tablet Take 1 tablet (400 mcg total) by mouth daily. (Patient not taking: Reported on 10/04/2018) 90 tablet 0   No current facility-administered medications for this visit.     Allergies as of 10/04/2018  . (No Known Allergies)    Family History  Problem Relation Age of Onset  . Obesity Mother   . Kidney disease Mother   . Diabetes Mother   . Diabetes Maternal Aunt   . Diabetes Maternal Uncle   . Diabetes Maternal Grandmother   . Heart attack Maternal Grandmother   . Diabetes Maternal Grandfather     Social History   Socioeconomic History  . Marital status: Married    Spouse name: [name]  . Number  of children: 0  . Years of education: Not on file  . Highest education level: Not on file  Occupational History  . Occupation: CAD Designer, television/film setoperator    Employer: [employer]  Social Needs  . Financial resource strain: Not on file  . Food insecurity:    Worry: Not on file    Inability: Not on file  . Transportation needs:    Medical: Not on file    Non-medical: Not on file  Tobacco Use  . Smoking status: Former Smoker    Years: 14.00    Types: Cigarettes    Last attempt to quit: 02/11/2017    Years since quitting: 1.6  . Smokeless tobacco: Never Used  Substance and Sexual Activity  . Alcohol use: Yes    Comment: occasionally   .  Drug use: No  . Sexual activity: Yes    Partners: Male    Birth control/protection: None    Comment: 1st intercourse- 16, partners- 3   Lifestyle  . Physical activity:    Days per week: Not on file    Minutes per session: Not on file  . Stress: Not on file  Relationships  . Social connections:    Talks on phone: Not on file    Gets together: Not on file    Attends religious service: Not on file    Active member of club or organization: Not on file    Attends meetings of clubs or organizations: Not on file    Relationship status: Not on file  . Intimate partner violence:    Fear of current or ex partner: Not on file    Emotionally abused: Not on file    Physically abused: Not on file    Forced sexual activity: Not on file  Other Topics Concern  . Not on file  Social History Narrative  . Not on file     Physical Exam: Ht 5\' 1"  (1.549 m) Comment: height measured without shoes  Wt 167 lb 8 oz (76 kg)   BMI 31.65 kg/m  Constitutional: generally well-appearing Psychiatric: alert and oriented x3 Eyes: extraocular movements intact Mouth: oral pharynx moist, no lesions Neck: supple no lymphadenopathy Cardiovascular: heart regular rate and rhythm Lungs: clear to auscultation bilaterally Abdomen: soft, nontender, nondistended, no obvious ascites, no peritoneal signs, normal bowel sounds Extremities: no lower extremity edema bilaterally Skin: no lesions on visible extremities   Assessment and plan: 32 y.o. female with GERD, elevated liver tests  She has GERD especially laying down, no alarm symptoms.  I recommended trial of nightly H2 blocker and she will return to see me in 2 months to update on her symptoms.  She also has asymptomatic elevated liver tests.  I suspect this is fatty liver given her obesity, fat seen in her liver 2 years ago by ultrasound.  She has never had formal testing to  check for other possible causes and I will do that for her now including a battery of  blood tests and I am going to repeat her ultrasound as well.  See the lab tests summarized in patient instructions.    Please see the "Patient Instructions" section for addition details about the plan.   Rob Buntinganiel Ayub Kirsh, MD [facility] 10/04/2018, 8:18 AM  Cc: Helane RimaWallace, Erica, DO

## 2018-10-04 NOTE — Patient Instructions (Addendum)
You will be set up for an ultrasound for elevated liver tests. RUQ.  [facility] on 10-10-2018 at 800am. Please arrive 15 minutes prior to your appointment for registration. Make certain not to have anything to eat or drink 6 hours prior to your appointment. Should you need to reschedule your appointment, please contact radiology at [phone]. This test typically takes about 30 minutes to perform.  Your provider has requested that you go to the basement level for lab work before leaving today. Press "B" on the elevator. The lab is located at the first door on the left as you exit the elevator.  You will get labs drawn today:  Hepatitis A (IgM and IgG), Hepatitis B surface antigen, Hepatitis B surface antibody, Hepatitis C antibody, total iron, ferritin, TIBC, ANA, AMA, anti smooth muscle antibody, alpha 1 antitrypsin, cerulloplasm, TTG, total IgA level, CBC, CMET, INR.  Start pepcid (famotidine) 20mg  pills, one pill at bedtime nightly.  Please return to see Dr. Christella HartiganJacobs in 2 months.

## 2018-10-07 LAB — IRON AND TIBC
Iron Saturation: 27 % (ref 15–55)
Iron: 84 ug/dL (ref 27–159)
Total Iron Binding Capacity: 313 ug/dL (ref 250–450)
UIBC: 229 ug/dL (ref 131–425)

## 2018-10-07 LAB — CERULOPLASMIN: Ceruloplasmin: 25.6 mg/dL (ref 19.0–39.0)

## 2018-10-09 LAB — HEPATITIS C ANTIBODY
Hepatitis C Ab: NONREACTIVE
SIGNAL TO CUT-OFF: 0.08 (ref <1.00)

## 2018-10-09 LAB — HEPATITIS B SURFACE ANTIBODY,QUALITATIVE: HEP B S AB: REACTIVE — AB

## 2018-10-09 LAB — HEPATITIS A ANTIBODY, IGM: Hep A IgM: NONREACTIVE

## 2018-10-09 LAB — ANTI-SMOOTH MUSCLE ANTIBODY, IGG: Actin (Smooth Muscle) Antibody (IGG): <20 U (ref <20)

## 2018-10-09 LAB — ANA: Anti Nuclear Antibody(ANA): NEGATIVE

## 2018-10-09 LAB — HEPATITIS B SURFACE ANTIGEN: Hepatitis B Surface Ag: NONREACTIVE

## 2018-10-09 LAB — MITOCHONDRIAL ANTIBODIES: Mitochondrial M2 Ab, IgG: <=20 U

## 2018-10-09 LAB — TISSUE TRANSGLUTAMINASE, IGA: (TTG) AB, IGA: 1 U/mL

## 2018-10-10 ENCOUNTER — Ambulatory Visit (HOSPITAL_COMMUNITY)
Admission: RE | Admit: 2018-10-10 | Discharge: 2018-10-10 | Disposition: A | Discharge status: Home / Self Care | Payer: BLUE CROSS/BLUE SHIELD | Source: Ambulatory Visit | Attending: Gastroenterology | Admitting: Gastroenterology

## 2018-10-10 ENCOUNTER — Encounter: Payer: Self-pay | Admitting: Family Medicine

## 2018-10-10 DIAGNOSIS — R945 Abnormal results of liver function studies: Secondary | ICD-10-CM | POA: Diagnosis not present

## 2018-10-10 DIAGNOSIS — R7989 Other specified abnormal findings of blood chemistry: Secondary | ICD-10-CM

## 2018-10-10 DIAGNOSIS — K824 Cholesterolosis of gallbladder: Secondary | ICD-10-CM | POA: Diagnosis not present

## 2018-10-18 ENCOUNTER — Other Ambulatory Visit: Payer: Self-pay

## 2018-10-18 DIAGNOSIS — R7989 Other specified abnormal findings of blood chemistry: Secondary | ICD-10-CM

## 2018-10-18 DIAGNOSIS — R945 Abnormal results of liver function studies: Principal | ICD-10-CM

## 2018-10-19 ENCOUNTER — Encounter: Payer: Self-pay | Admitting: Gastroenterology

## 2018-10-20 ENCOUNTER — Other Ambulatory Visit: Payer: Self-pay | Admitting: Family Medicine

## 2018-10-20 DIAGNOSIS — J4520 Mild intermittent asthma, uncomplicated: Secondary | ICD-10-CM

## 2018-10-25 ENCOUNTER — Other Ambulatory Visit: Payer: Self-pay | Admitting: Family Medicine

## 2018-10-25 DIAGNOSIS — R51 Headache: Principal | ICD-10-CM

## 2018-10-25 DIAGNOSIS — R519 Headache, unspecified: Secondary | ICD-10-CM

## 2018-11-24 ENCOUNTER — Other Ambulatory Visit: Payer: Self-pay | Admitting: Family Medicine

## 2018-11-24 DIAGNOSIS — F4323 Adjustment disorder with mixed anxiety and depressed mood: Secondary | ICD-10-CM

## 2018-11-30 ENCOUNTER — Encounter: Payer: Self-pay | Admitting: Physician Assistant

## 2018-11-30 ENCOUNTER — Ambulatory Visit: Payer: BLUE CROSS/BLUE SHIELD | Admitting: Physician Assistant

## 2018-11-30 ENCOUNTER — Encounter: Payer: Self-pay | Admitting: Family Medicine

## 2018-11-30 ENCOUNTER — Ambulatory Visit: Payer: Self-pay | Admitting: *Deleted

## 2018-11-30 VITALS — BP 122/80 | HR 94 | Temp 99.3°F | Ht 61.0 in | Wt 158.5 lb

## 2018-11-30 DIAGNOSIS — R000 Tachycardia, unspecified: Secondary | ICD-10-CM

## 2018-11-30 DIAGNOSIS — R110 Nausea: Secondary | ICD-10-CM | POA: Diagnosis not present

## 2018-11-30 DIAGNOSIS — R202 Paresthesia of skin: Secondary | ICD-10-CM | POA: Diagnosis not present

## 2018-11-30 DIAGNOSIS — E785 Hyperlipidemia, unspecified: Secondary | ICD-10-CM

## 2018-11-30 DIAGNOSIS — E1169 Type 2 diabetes mellitus with other specified complication: Secondary | ICD-10-CM | POA: Diagnosis not present

## 2018-11-30 NOTE — Telephone Encounter (Signed)
Pt called with complaints of her heart racing since 11/28/2018; she also has headache and nausea since that time; the pt says that today she has a burning sensation at the top of her forehead with her headache; she also reports bilateral foot swelling for the past 2 weeks; recommendations made per nurse triage protocol; the pt normally sees Dr Helane Rima but she has no availability within parameters per guidelines; pt offered and accepted appointment with Jarold Motto, [facility], 11/30/2018 at 1540; will route to office for notification.     Reason for Disposition . New or worsened ankle swelling  Answer Assessment - Initial Assessment Questions 1. DESCRIPTION: "Please describe your heart rate or heart beat that you are having" (e.g., fast/slow, regular/irregular, skipped or extra beats, "palpitations")     fast 2. ONSET: "When did it start?" (Minutes, hours or days)      11/28/2018 at 1800 3. DURATION: "How long does it last" (e.g., seconds, minutes, hours)     days 4. PATTERN "Does it come and go, or has it been constant since it started?"  "Does it get worse with exertion?"   "Are you feeling it now?"     Intermittent; not dependent on activity level 5. TAP: "Using your hand, can you tap out what you are feeling on a chair or table in front of you, so that I can hear?" (Note: not all patients can do this)       n/a 6. HEART RATE: "Can you tell me your heart rate?" "How many beats in 15 seconds?"  (Note: not all patients can do this)       118 per apple watch 7. RECURRENT SYMPTOM: "Have you ever had this before?" If so, ask: "When was the last time?" and "What happened that time?"      Yes when having panic attack 8. CAUSE: "What do you think is causing the palpitations?"     Not sure 9. CARDIAC HISTORY: "Do you have any history of heart disease?" (e.g., heart attack, angina, bypass surgery, angioplasty, arrhythmia)      Increased heart rate when having panic attack 10. OTHER  SYMPTOMS: "Do you have any other symptoms?" (e.g., dizziness, chest pain, sweating, difficulty breathing)       Nausea, headache 11. PREGNANCY: "Is there any chance you are pregnant?" "When was your last menstrual period?"       No negative home pregrenacy test 11/29/2018; LMP 11/18/2018  Protocols used: HEART RATE AND HEARTBEAT QUESTIONS-A-AH

## 2018-11-30 NOTE — Progress Notes (Signed)
Stacey Morales is a 33 y.o. female here for a new problem.  I acted as a Neurosurgeonscribe for Energy East CorporationSamantha Margurete Guaman, PA-C Corky Mullonna Orphanos, LPN  History of Present Illness:   Chief Complaint  Patient presents with  . Headache    Since Monday  . Chest Pain    Starting yesterday  . Lt arm pain    Starting yesterday    HPI   Pt here for multiple issues.  She states that on Monday, she started having episodes while she is sitting down and working where her HR increases (up to 118 on Apple watch), she develops nausea, HA, L-sided chest tightness that radiates down left arm. She is unable to tell me why she didn't go to the ER.  She is diabetic, last HgbA1c from Aug 02 2018 of 6.9. Currently on Metformin and Trulicity. She is going to try start conceiving in the Spring, knows to stop Trulicity, however does admit to unprotected sex at least once last month. Took urine pregnancy test a few days ago and was negative. Endorses some intermittent tingling to the middle of her forehead.  Since January 1 she has started intermittent fasting. Doesn't eat first meal until 4p daily.   Does have history of H pylori and increased LFTs. She states that her nausea is currently intermittent and is having R shoulder blade pain occasionally. She has seen Dr. Christella HartiganJacobs with [facility] and is due for follow-up at the end of this month.  Wt Readings from Last 3 Encounters:  11/30/18 158 lb 8 oz (71.9 kg)  10/04/18 167 lb 8 oz (76 kg)  09/28/18 169 lb 12.8 oz (77 kg)     Past Medical History:  Diagnosis Date  . Diabetes (HCC)   . GERD (gastroesophageal reflux disease)   . HLD (hyperlipidemia)   . Kidney stone      Social History   Socioeconomic History  . Marital status: Married    Spouse name: Stacey Morales  . Number of children: 0  . Years of education: Not on file  . Highest education level: Not on file  Occupational History  . Occupation: CAD Designer, television/film setoperator    Employer: [employer]  Social Needs  . Financial  resource strain: Not on file  . Food insecurity:    Worry: Not on file    Inability: Not on file  . Transportation needs:    Medical: Not on file    Non-medical: Not on file  Tobacco Use  . Smoking status: Former Smoker    Years: 14.00    Types: Cigarettes    Last attempt to quit: 02/11/2017    Years since quitting: 1.8  . Smokeless tobacco: Never Used  Substance and Sexual Activity  . Alcohol use: Yes    Comment: occasionally   . Drug use: No  . Sexual activity: Yes    Partners: Male    Birth control/protection: None    Comment: 1st intercourse- 16, partners- 3   Lifestyle  . Physical activity:    Days per week: Not on file    Minutes per session: Not on file  . Stress: Not on file  Relationships  . Social connections:    Talks on phone: Not on file    Gets together: Not on file    Attends religious service: Not on file    Active member of club or organization: Not on file    Attends meetings of clubs or organizations: Not on file    Relationship status:  Not on file  . Intimate partner violence:    Fear of current or ex partner: Not on file    Emotionally abused: Not on file    Physically abused: Not on file    Forced sexual activity: Not on file  Other Topics Concern  . Not on file  Social History Narrative  . Not on file    Past Surgical History:  Procedure Laterality Date  . ARM SURGERY Right     Family History  Problem Relation Age of Onset  . Obesity Mother   . Kidney disease Mother   . Diabetes Mother   . Diabetes Maternal Aunt   . Diabetes Maternal Uncle   . Diabetes Maternal Grandmother   . Heart attack Maternal Grandmother   . Diabetes Maternal Grandfather     No Known Allergies  Current Medications:   Current Outpatient Medications:  .  Dulaglutide (TRULICITY) 1.5 MG/0.5ML SOPN, Inject 1 pen into the skin once a week., Disp: 4 pen, Rfl: 1 .  fluticasone (FLOVENT HFA) 110 MCG/ACT inhaler, Inhale 2 puffs into the lungs 2 (two) times daily.,  Disp: 1 Inhaler, Rfl: 12 .  folic acid (FOLVITE) 400 MCG tablet, Take 1 tablet (400 mcg total) by mouth daily., Disp: 90 tablet, Rfl: 0 .  Magnesium 400 MG CAPS, Take 1 capsule by mouth at bedtime., Disp: 30 capsule, Rfl: 4 .  metFORMIN (GLUCOPHAGE XR) 750 MG 24 hr tablet, Take 1 tablet (750 mg total) by mouth daily with breakfast., Disp: 90 tablet, Rfl: 2 .  PROAIR HFA 108 (90 Base) MCG/ACT inhaler, INHALE 2 PUFFS INTO THE LUNGS EVERY 6 HOURS AS NEEDED FOR WHEEZING OR SHORTNESS OF BREATH, Disp: 8.5 g, Rfl: 0   Review of Systems:   Review of Systems  Constitutional: Negative for chills, fever, malaise/fatigue and weight loss.  Eyes: Positive for pain. Negative for blurred vision and double vision.  Respiratory: Negative for cough, sputum production and shortness of breath.   Cardiovascular: Positive for chest pain. Negative for orthopnea, claudication and leg swelling.  Gastrointestinal: Positive for constipation and nausea. Negative for abdominal pain, diarrhea, heartburn and vomiting.  Musculoskeletal: Negative for joint pain, myalgias and neck pain.  Neurological: Positive for headaches. Negative for dizziness and tingling.  Psychiatric/Behavioral: Negative for depression. The patient is nervous/anxious.       Vitals:   Vitals:   11/30/18 1552  BP: 122/80  Pulse: 94  Temp: 99.3 F (37.4 C)  TempSrc: Oral  SpO2: 96%  Weight: 158 lb 8 oz (71.9 kg)  Height: 5\' 1"  (1.549 m)     Body mass index is 29.95 kg/m.  Physical Exam:   Physical Exam Vitals signs and nursing note reviewed.  Constitutional:      General: She is not in acute distress.    Appearance: She is well-developed. She is not ill-appearing or toxic-appearing.  Cardiovascular:     Rate and Rhythm: Normal rate and regular rhythm.     Pulses: Normal pulses.     Heart sounds: Normal heart sounds, S1 normal and S2 normal.     Comments: No LE edema Pulmonary:     Effort: Pulmonary effort is normal.     Breath  sounds: Normal breath sounds.  Abdominal:     General: Abdomen is flat. Bowel sounds are normal.     Palpations: Abdomen is soft.     Tenderness: There is no abdominal tenderness. There is no right CVA tenderness or left CVA tenderness.  Skin:  General: Skin is warm and dry.  Neurological:     Mental Status: She is alert.     GCS: GCS eye subscore is 4. GCS verbal subscore is 5. GCS motor subscore is 6.     Cranial Nerves: Cranial nerves are intact.     Sensory: Sensation is intact.     Motor: Motor function is intact.     Coordination: Coordination is intact.  Psychiatric:        Speech: Speech normal.        Behavior: Behavior normal. Behavior is cooperative.      Assessment and Plan:   Kc was seen today for headache, chest pain and lt arm pain.  Diagnoses and all orders for this visit:  Tachycardia EKG tracing is personally reviewed.  EKG notes NSR.  No acute changes. No tachycardia on my exam or on EKG. Will check labs. She is requesting cardiology referral. ER precautions advised. -     EKG 12-Lead -     Cancel: Comprehensive metabolic panel -     Cancel: CBC with Differential/Platelet -     Cancel: TSH -     CBC with Differential/Platelet; Future -     Comprehensive metabolic panel; Future -     TSH; Future -     Ambulatory referral to Cardiology  Type 2 diabetes mellitus with other specified complication, without long-term current use of insulin (HCC) Will update HgbA1c and adjust DM regimen as necessary. She is concerned that she is having reactions at the injection site where she is giving herself Trulicity. Will consider d/c'ing if HgbA1c improved. Discussed need for contraception while on this medication as well. I did advise her that eating regularly throughout the day may help her symptoms, and that intermittent fasting may be contributing to some of her symptoms. -     Hemoglobin A1c; Future  Tingling -     Vitamin B12; Future  Nausea Will check labs,  including pregnancy. Needs scheduled follow-up with GI soon. Will defer to them if symptoms persists. -     B-HCG Quant; Future -     Urinalysis, Routine w reflex microscopic; Future  Hyperlipidemia associated with type 2 diabetes mellitus (HCC) -     Cancel: Lipid panel -     Lipid panel; Future    . Reviewed expectations re: course of current medical issues. . Discussed self-management of symptoms. . Outlined signs and symptoms indicating need for more acute intervention. . Patient verbalized understanding and all questions were answered. . See orders for this visit as documented in the electronic medical record. . Patient received an After-Visit Summary.  CMA or LPN served as scribe during this visit. History, Physical, and Plan performed by medical provider. The above documentation has been reviewed and is accurate and complete.  I spent 40 minutes with this patient, greater than 50% was face-to-face time counseling regarding the above diagnoses.   Jarold Motto, PA-C

## 2018-11-30 NOTE — Patient Instructions (Signed)
It was great to see you!  Please make an appointment for labs on your way out.  I will be in touch with your lab results and recommendations.  I would like for you to consider eating regularly throughout the day.  If you have any significant chest pain or worsening, severe headache, go to the emergency room.  You will be contacted about your referral to cardiology.  Take care,  Jarold Motto PA-C

## 2018-11-30 NOTE — Telephone Encounter (Signed)
See note

## 2018-12-01 ENCOUNTER — Other Ambulatory Visit: Payer: BLUE CROSS/BLUE SHIELD

## 2018-12-02 ENCOUNTER — Other Ambulatory Visit (INDEPENDENT_AMBULATORY_CARE_PROVIDER_SITE_OTHER): Payer: BLUE CROSS/BLUE SHIELD

## 2018-12-02 DIAGNOSIS — R110 Nausea: Secondary | ICD-10-CM

## 2018-12-02 DIAGNOSIS — E785 Hyperlipidemia, unspecified: Secondary | ICD-10-CM

## 2018-12-02 DIAGNOSIS — E1169 Type 2 diabetes mellitus with other specified complication: Secondary | ICD-10-CM | POA: Diagnosis not present

## 2018-12-02 DIAGNOSIS — R202 Paresthesia of skin: Secondary | ICD-10-CM

## 2018-12-02 DIAGNOSIS — R000 Tachycardia, unspecified: Secondary | ICD-10-CM | POA: Diagnosis not present

## 2018-12-02 NOTE — Addendum Note (Signed)
Addended by: Young Berry T on: 12/02/2018 04:23 PM   Modules accepted: Orders

## 2018-12-03 LAB — COMPREHENSIVE METABOLIC PANEL
AG Ratio: 1.8 (calc) (ref 1.0–2.5)
ALT: 71 U/L — ABNORMAL HIGH (ref 6–29)
AST: 43 U/L — AB (ref 10–30)
Albumin: 4.9 g/dL (ref 3.6–5.1)
Alkaline phosphatase (APISO): 68 U/L (ref 31–125)
BUN: 9 mg/dL (ref 7–25)
CO2: 24 mmol/L (ref 20–32)
Calcium: 9.9 mg/dL (ref 8.6–10.2)
Chloride: 103 mmol/L (ref 98–110)
Creat: 0.89 mg/dL (ref 0.50–1.10)
GLUCOSE: 75 mg/dL (ref 65–99)
Globulin: 2.8 g/dL (calc) (ref 1.9–3.7)
Potassium: 4.3 mmol/L (ref 3.5–5.3)
Sodium: 138 mmol/L (ref 135–146)
Total Bilirubin: 0.7 mg/dL (ref 0.2–1.2)
Total Protein: 7.7 g/dL (ref 6.1–8.1)

## 2018-12-03 LAB — LIPID PANEL
Cholesterol: 218 mg/dL — ABNORMAL HIGH (ref <200)
HDL: 49 mg/dL — ABNORMAL LOW (ref >=50)
LDL Cholesterol (Calc): 146 mg/dL (calc) — ABNORMAL HIGH
Non-HDL Cholesterol (Calc): 169 mg/dL (calc) — ABNORMAL HIGH (ref <130)
Total CHOL/HDL Ratio: 4.4 (calc) (ref <5.0)
Triglycerides: 116 mg/dL (ref <150)

## 2018-12-03 LAB — CBC WITH DIFFERENTIAL/PLATELET
Absolute Monocytes: 440 cells/uL (ref 200–950)
BASOS PCT: 0.8 %
Basophils Absolute: 80 cells/uL (ref 0–200)
EOS ABS: 210 {cells}/uL (ref 15–500)
Eosinophils Relative: 2.1 %
HCT: 44.3 % (ref 35.0–45.0)
Hemoglobin: 15.4 g/dL (ref 11.7–15.5)
Lymphs Abs: 2680 cells/uL (ref 850–3900)
MCH: 31.1 pg (ref 27.0–33.0)
MCHC: 34.8 g/dL (ref 32.0–36.0)
MCV: 89.5 fL (ref 80.0–100.0)
MPV: 11.3 fL (ref 7.5–12.5)
Monocytes Relative: 4.4 %
Neutro Abs: 6590 cells/uL (ref 1500–7800)
Neutrophils Relative %: 65.9 %
Platelets: 265 10*3/uL (ref 140–400)
RBC: 4.95 10*6/uL (ref 3.80–5.10)
RDW: 12.3 % (ref 11.0–15.0)
Total Lymphocyte: 26.8 %
WBC: 10 10*3/uL (ref 3.8–10.8)

## 2018-12-03 LAB — HEMOGLOBIN A1C
Hgb A1c MFr Bld: 4.8 % of total Hgb (ref <5.7)
Mean Plasma Glucose: 91 (calc)
eAG (mmol/L): 5 (calc)

## 2018-12-03 LAB — HCG, QUANTITATIVE, PREGNANCY: HCG, Total, QN: <2 m[IU]/mL

## 2018-12-03 LAB — VITAMIN B12: Vitamin B-12: 427 pg/mL (ref 200–1100)

## 2018-12-03 LAB — TSH: TSH: 0.49 mIU/L

## 2018-12-05 ENCOUNTER — Encounter: Payer: Self-pay | Admitting: Physician Assistant

## 2018-12-20 ENCOUNTER — Ambulatory Visit: Payer: BLUE CROSS/BLUE SHIELD | Admitting: Cardiology

## 2018-12-26 ENCOUNTER — Other Ambulatory Visit: Payer: Self-pay | Admitting: Family Medicine

## 2018-12-26 DIAGNOSIS — J4520 Mild intermittent asthma, uncomplicated: Secondary | ICD-10-CM

## 2018-12-27 ENCOUNTER — Encounter: Payer: Self-pay | Admitting: Cardiology

## 2018-12-27 ENCOUNTER — Other Ambulatory Visit: Payer: Self-pay

## 2018-12-27 ENCOUNTER — Ambulatory Visit: Payer: BLUE CROSS/BLUE SHIELD | Admitting: Cardiology

## 2018-12-27 VITALS — BP 110/70 | HR 82 | Ht 61.0 in | Wt 157.4 lb

## 2018-12-27 DIAGNOSIS — R072 Precordial pain: Secondary | ICD-10-CM | POA: Diagnosis not present

## 2018-12-27 DIAGNOSIS — E1169 Type 2 diabetes mellitus with other specified complication: Secondary | ICD-10-CM

## 2018-12-27 DIAGNOSIS — E785 Hyperlipidemia, unspecified: Secondary | ICD-10-CM | POA: Diagnosis not present

## 2018-12-27 DIAGNOSIS — R002 Palpitations: Secondary | ICD-10-CM

## 2018-12-27 NOTE — Patient Instructions (Signed)
Medication Instructions:  Your Physician recommend you continue on your current medication as directed.    If you need a refill on your cardiac medications before your next appointment, please call your pharmacy.   Lab work: None  Testing/Procedures: Our physician has recommended that you wear an 7  DAY ZIO-PATCH monitor. The Zio patch cardiac monitor continuously records heart rhythm data for up to 14 days, this is for patients being evaluated for multiple types heart rhythms. For the first 24 hours post application, please avoid getting the Zio monitor wet in the shower or by excessive sweating during exercise. After that, feel free to carry on with regular activities. Keep soaps and lotions away from the ZIO XT Patch.   This will be placed at our [address] location - [address], [address].      Follow-Up: At [facility], you and your health needs are our priority.  As part of our continuing mission to provide you with exceptional heart care, we have created designated Provider Care Teams.  These Care Teams include your primary Cardiologist (physician) and Advanced Practice Providers (APPs -  Physician Assistants and Nurse Practitioners) who all work together to provide you with the care you need, when you need it. You will need a follow up appointment as needed  Please call our office 2 months in advance to schedule this appointment.  You may see Dr. Christopher or one of the following Advanced Practice Providers on your designated Care Team:   Rhonda Barrett, PA-C . Kathryn Lawrence, DNP, ANP  *   

## 2018-12-27 NOTE — Progress Notes (Signed)
Cardiology Office Note:    Date:  12/27/2018   ID:  Stacey Morales, DOB 04-19-1986, MRN [id number]  PCP:  Helane Rima, DO  Cardiologist:  Jodelle Red, MD PhD  Referring MD: Jarold Motto, [place]   No chief complaint on file.   History of Present Illness:    Stacey Morales is a 33 y.o. female with a hx of diabetes, type II who is seen as a new consult at the request of Jarold Motto, [place] for the evaluation and management of tachycardia.  Tachycardia/palpitations: -Initial onset: 8-9 years ago, noticed heart started beating fast with no reason (ie watching TV, getting ready for sleep). Lasted about two weeks, then was started on an antidepressant with improvement and complete cessation after 2-3 months. Can go several months without any issues, then recurs randomly. -Frequency/duration: Currently happening about 4 times/week, usually lasts a few minutes, longest 20 minutes -Associated symptoms: right sided chest/shoulder pain, but this occurs at a different time than her heart racing. No diaphoresis/shortness of breath with this symptom, not exertional -Aggravating/alleviating factors: nothing clearly triggers/makes worse. Better when she rests, takes deeps breaths -Syncope/near syncope: none -Prior cardiac history: none -Prior ECG: normal sinus rhythm -Prior workup: none, no home monitor -Prior treatment: none -Possible medication interactions: has albuterol inhaler but rarely used (3 times total) -Caffeine: 1 cup of coffee in AM -Alcohol: 1 glass wine every two weeks -Tobacco: former, quit the beginning of this year, was smoking only 1 cigarette/week -OTC supplements: none -Comorbidities: diabetes -Exercise level: can climb stairs, occasionally goes to gym but not routinely. Has been doing more exercise since the beginning of this year, but noticed that this makes her heart race. -Labs: TSH, kidney function/electrolytes, CBC reviewed. -Cardiac ROS: no shortness of  breath, no PND, no orthopnea, no LE edema. -Family history: Gma passed away with heart attack in her 71s. No other heart disease.   Past Medical History:  Diagnosis Date  . Diabetes (HCC)   . GERD (gastroesophageal reflux disease)   . HLD (hyperlipidemia)   . Kidney stone     Past Surgical History:  Procedure Laterality Date  . ARM SURGERY Right     Current Medications: Current Outpatient Medications on File Prior to Visit  Medication Sig  . Dulaglutide (TRULICITY) 1.5 MG/0.5ML SOPN Inject 1 pen into the skin once a week.  . fluticasone (FLOVENT HFA) 110 MCG/ACT inhaler Inhale 2 puffs into the lungs 2 (two) times daily.  . folic acid (FOLVITE) 400 MCG tablet Take 1 tablet (400 mcg total) by mouth daily.  . Magnesium 400 MG CAPS Take 1 capsule by mouth at bedtime.  . metFORMIN (GLUCOPHAGE XR) 750 MG 24 hr tablet Take 1 tablet (750 mg total) by mouth daily with breakfast.  . PROAIR HFA 108 (90 Base) MCG/ACT inhaler INHALE 2 PUFFS INTO THE LUNGS EVERY 6 HOURS AS NEEDED FOR WHEEZING OR SHORTNESS OF BREATH   No current facility-administered medications on file prior to visit.      Allergies:   Patient has no known allergies.   Social History   Socioeconomic History  . Marital status: Married    Spouse name: Madaline Guthrie  . Number of children: 0  . Years of education: Not on file  . Highest education level: Not on file  Occupational History  . Occupation: CAD Designer, television/film set    Employer: [employer]  Social Needs  . Financial resource strain: Not on file  . Food insecurity:    Worry: Not on file  Inability: Not on file  . Transportation needs:    Medical: Not on file    Non-medical: Not on file  Tobacco Use  . Smoking status: Former Smoker    Years: 14.00    Types: Cigarettes    Last attempt to quit: 02/11/2017    Years since quitting: 1.8  . Smokeless tobacco: Never Used  Substance and Sexual Activity  . Alcohol use: Yes    Comment: occasionally   . Drug use: No   . Sexual activity: Yes    Partners: Male    Birth control/protection: None    Comment: 1st intercourse- 16, partners- 3   Lifestyle  . Physical activity:    Days per week: Not on file    Minutes per session: Not on file  . Stress: Not on file  Relationships  . Social connections:    Talks on phone: Not on file    Gets together: Not on file    Attends religious service: Not on file    Active member of club or organization: Not on file    Attends meetings of clubs or organizations: Not on file    Relationship status: Not on file  Other Topics Concern  . Not on file  Social History Narrative  . Not on file     Family History: The patient's family history includes Diabetes in her maternal aunt, maternal grandfather, maternal grandmother, maternal uncle, and mother; Heart attack in her maternal grandmother; Kidney disease in her mother; Obesity in her mother.  ROS:   Please see the history of present illness.  Additional pertinent ROS:  Constitutional: Negative for chills, fever, night sweats, unintentional weight loss  HENT: Negative for ear pain and hearing loss.   Eyes: Negative for loss of vision and eye pain.  Respiratory: Negative for cough, sputum, shortness of breath, wheezing.   Cardiovascular: See HPI. Gastrointestinal: Negative for melena, and hematochezia. Positive for abdominal pain and constipation Genitourinary: Negative for dysuria and hematuria.  Musculoskeletal: Negative for falls and myalgias.  Skin: Negative for itching and rash.  Neurological: Negative for focal weakness, focal sensory changes and loss of consciousness.  Endo/Heme/Allergies: Does not bruise/bleed easily.    EKGs/Labs/Other Studies Reviewed:    The following studies were reviewed today: Prior notes and ECG  EKG:  EKG is personally reviewed.  The ekg ordered today demonstrates normal sinus rhythm at 82 bpm.  Recent Labs: 01/21/2018: Magnesium 1.9 12/02/2018: ALT 71; BUN 9; Creat 0.89;  Hemoglobin 15.4; Platelets 265; Potassium 4.3; Sodium 138; TSH 0.49  Recent Lipid Panel    Component Value Date/Time   CHOL 218 (H) 12/02/2018 1624   CHOL 211 (H) 07/01/2017 1125   TRIG 116 12/02/2018 1624   HDL 49 (L) 12/02/2018 1624   HDL 38 (L) 07/01/2017 1125   CHOLHDL 4.4 12/02/2018 1624   VLDL 48.6 (H) 08/02/2018 0954   LDLCALC 146 (H) 12/02/2018 1624   LDLDIRECT 149.0 08/02/2018 0954    Physical Exam:    VS:  BP 110/70   Pulse 82   Ht 5\' 1"  (1.549 m)   Wt 157 lb 6.4 oz (71.4 kg)   BMI 29.74 kg/m     Wt Readings from Last 3 Encounters:  12/27/18 157 lb 6.4 oz (71.4 kg)  11/30/18 158 lb 8 oz (71.9 kg)  10/04/18 167 lb 8 oz (76 kg)     GEN: Well nourished, well developed in no acute distress HEENT: Normal NECK: No JVD; No carotid bruits LYMPHATICS: No lymphadenopathy  CARDIAC: regular rhythm, normal S1 and S2, no murmurs, rubs, gallops. Radial and DP pulses 2+ bilaterally. RESPIRATORY:  Clear to auscultation without rales, wheezing or rhonchi  ABDOMEN: Soft, non-tender, non-distended MUSCULOSKELETAL:  No edema; No deformity  SKIN: Warm and dry NEUROLOGIC:  Alert and oriented x 3 PSYCHIATRIC:  Normal affect   ASSESSMENT:    1. Palpitation   2. Hyperlipidemia associated with type 2 diabetes mellitus (HCC)    PLAN:    Palpitations: no high risk features. Normal baseline ECG. Could be SVT vs. PVCs vs noncardiac cause. TSH WNL, no anemia, kidney function normal, mildly elevated LFTs, A1c with excellent control -will do 7 day Zio to evaluate rhythm. Instructed on use. Given uncertainty re: appointment scheduling with coronavirus outbreak, we may not be able to do this for several weeks. She understands and is ok with this -if SVT/frequent PVCs seen on monitor, would get echo -if normal monitor, would support noncardiac cause  Chest/shoulder pain is atypical, right sided predominantly, not related to same time as her hard beats, very brief, nonexertional. No further  workup at this time, but instructed that if it changes/worsens, to let the office know.  Hyperlipidemia with type II diabetes: LDL 146, TG 116, HDL 49, Tchol 218. Would benefit from a statin in the long term, but with plans for possible pregnancy, will not start at this time.   Plan for follow up: to be determined based on results of monitor. If normal monitor, follow up PRN.  Medication Adjustments/Labs and Tests Ordered: Current medicines are reviewed at length with the patient today.  Concerns regarding medicines are outlined above.  Orders Placed This Encounter  Procedures  . LONG TERM MONITOR (3-14 DAYS)  . EKG 12-Lead   No orders of the defined types were placed in this encounter.   Patient Instructions  Medication Instructions:  Your Physician recommend you continue on your current medication as directed.    If you need a refill on your cardiac medications before your next appointment, please call your pharmacy.   Lab work: None  Testing/Procedures: Our physician has recommended that you wear an 7 DAY ZIO-PATCH monitor. The Zio patch cardiac monitor continuously records heart rhythm data for up to 14 days, this is for patients being evaluated for multiple types heart rhythms. For the first 24 hours post application, please avoid getting the Zio monitor wet in the shower or by excessive sweating during exercise. After that, feel free to carry on with regular activities. Keep soaps and lotions away from the ZIO XT Patch.   This will be placed at our [facility] location - [address].      Follow-Up: At [facility], you and your health needs are our priority.  As part of our continuing mission to provide you with exceptional heart care, we have created designated Provider Care Teams.  These Care Teams include your primary Cardiologist (physician) and Advanced Practice Providers (APPs -  Physician Assistants and Nurse Practitioners) who all work together to  provide you with the care you need, when you need it. You will need a follow up appointment as needed.  Please call our office 2 months in advance to schedule this appointment.  You may see Dr. Cristal Deer or one of the following Advanced Practice Providers on your designated Care Team:   Theodore Demark, PA-C . Joni Reining, DNP, ANP        Signed, Jodelle Red, MD PhD 12/27/2018 8:37 AM  [facility]

## 2018-12-30 ENCOUNTER — Encounter: Payer: Self-pay | Admitting: Family Medicine

## 2019-01-04 ENCOUNTER — Encounter: Payer: Self-pay | Admitting: Family Medicine

## 2019-01-08 ENCOUNTER — Other Ambulatory Visit: Payer: Self-pay

## 2019-01-08 NOTE — Telephone Encounter (Signed)
App made letter sent

## 2019-01-10 NOTE — Progress Notes (Signed)
Virtual Visit via Video   I connected with STEVETTE FOLLETTE on 01/11/19 at  7:20 AM EDT by a video enabled telemedicine application and verified that I am speaking with the correct person using two identifiers. Location patient: Home Location provider: [facility], Office Persons participating in the virtual visit: TREESA GRATE, Helane Rima, DO, Barnie Mort, as CMA scribe.   I discussed the limitations of evaluation and management by telemedicine and the availability of in person appointments. The patient expressed understanding and agreed to proceed.  Subjective:   HPI: Patient in for follow up was seen by [facility] on 2/21. She was informed to stop Trulicity based on labs. She has not been checking her blood sugars at all. She has had improvement with symptoms after stopping. She also has stopped her intermittent fasting. She did have some issues with the injection site. She was on the Trulicity 1.5. When she was on the lower dose she did not have that problem. If we start back at later date we will only do the lower dose.    Palpitations: patient has bene seen by Cardiology. She has appointment for event monitor. She has had increased anxiety due to Covid. She has an appointment for that on 01-23-2019.   Reviewed all precautions and expectations with prevention of Covid-19. We have given note to be out of work but she has not used yet. She is still working but she has meeting with HR today to see if she can work from home.  ROS: See pertinent positives and negatives per HPI.  Patient Active Problem List   Diagnosis Date Noted  . Migraine without aura and without status migrainosus, not intractable 09/28/2018  . Reactive airway disease 09/28/2018  . Nonintractable episodic headache 09/28/2018  . Dyspepsia 09/28/2018  . Situational mixed anxiety and depressive disorder 09/28/2018  . H. pylori infection 08/10/2018  . Type 2 diabetes mellitus with other specified complication (HCC)  08/10/2018  . Hyperlipidemia associated with type 2 diabetes mellitus (HCC) 08/10/2018  . Polycythemia 08/10/2018  . Female infertility 09/12/2017  . PCOS (polycystic ovarian syndrome), with polycystic ovaries 09/12/2017  . Vitamin D deficiency 09/12/2017  . Class 1 obesity with serious comorbidity and body mass index (BMI) of 31.0 to 31.9 in adult 07/01/2017    Social History   Tobacco Use  . Smoking status: Former Smoker    Years: 14.00    Types: Cigarettes    Last attempt to quit: 02/11/2017    Years since quitting: 1.9  . Smokeless tobacco: Never Used  Substance Use Topics  . Alcohol use: Yes    Comment: occasionally     Current Outpatient Medications:  .  albuterol (PROVENTIL HFA;VENTOLIN HFA) 108 (90 Base) MCG/ACT inhaler, INHALE 2 PUFFS INTO THE LUNGS EVERY 6 HOURS AS NEEDED FOR WHEEZING OR SHORTNESS OF BREATH, Disp: 8.5 g, Rfl: 0 .  fluticasone (FLOVENT HFA) 110 MCG/ACT inhaler, Inhale 2 puffs into the lungs 2 (two) times daily., Disp: 1 Inhaler, Rfl: 12 .  folic acid (FOLVITE) 400 MCG tablet, Take 1 tablet (400 mcg total) by mouth daily., Disp: 90 tablet, Rfl: 0 .  metFORMIN (GLUCOPHAGE XR) 750 MG 24 hr tablet, Take 1 tablet (750 mg total) by mouth daily with breakfast., Disp: 90 tablet, Rfl: 2  No Known Allergies  Objective:   VITALS: Per patient if applicable, see vitals. GENERAL: Alert, appears well and in no acute distress. HEENT: Atraumatic, conjunctiva clear, no obvious abnormalities on inspection of external nose and  ears. NECK: Normal movements of the head and neck. CARDIOPULMONARY: No increased WOB. Speaking in clear sentences. I:E ratio WNL.  MS: Moves all visible extremities without noticeable abnormality. PSYCH: Pleasant and cooperative, well-groomed. Speech normal rate and rhythm. Affect is appropriate. Insight and judgement are appropriate. Attention is focused, linear, and appropriate.  NEURO: CN grossly intact. Oriented as arrived to appointment on time  with no prompting. Moves both UE equally.  SKIN: No obvious lesions, wounds, erythema, or cyanosis noted on face or hands.  Assessment and Plan:   Ronnika was seen today for follow-up.  Diagnoses and all orders for this visit:  Type 2 diabetes mellitus with other specified complication, without long-term current use of insulin (HCC) Comments: Stable. Holding Trulicity. May restart 0.75 dose in the future.  PCOS (polycystic ovarian syndrome), with polycystic ovaries  Mild intermittent reactive airway disease without complication Comments: Stable but higher risk in COVID environment. Recommend working from home.   Tachycardia Comments: Improved. Will have Zio patch in April.    . Reviewed expectations re: course of current medical issues. . Discussed self-management of symptoms. . Outlined signs and symptoms indicating need for more acute intervention. . Patient verbalized understanding and all questions were answered. Marland Kitchen Health Maintenance issues including appropriate healthy diet, exercise, and smoking avoidance were discussed with patient. . See orders for this visit as documented in the electronic medical record.  Helane Rima, DO 01/11/2019

## 2019-01-11 ENCOUNTER — Other Ambulatory Visit: Payer: Self-pay

## 2019-01-11 ENCOUNTER — Ambulatory Visit (INDEPENDENT_AMBULATORY_CARE_PROVIDER_SITE_OTHER): Payer: BLUE CROSS/BLUE SHIELD | Admitting: Family Medicine

## 2019-01-11 ENCOUNTER — Encounter: Payer: Self-pay | Admitting: Family Medicine

## 2019-01-11 VITALS — Ht 61.0 in | Wt 157.0 lb

## 2019-01-11 DIAGNOSIS — E282 Polycystic ovarian syndrome: Secondary | ICD-10-CM | POA: Diagnosis not present

## 2019-01-11 DIAGNOSIS — R000 Tachycardia, unspecified: Secondary | ICD-10-CM

## 2019-01-11 DIAGNOSIS — J4520 Mild intermittent asthma, uncomplicated: Secondary | ICD-10-CM

## 2019-01-11 DIAGNOSIS — E1169 Type 2 diabetes mellitus with other specified complication: Secondary | ICD-10-CM

## 2019-01-18 ENCOUNTER — Encounter: Payer: Self-pay | Admitting: Family Medicine

## 2019-01-19 ENCOUNTER — Telehealth: Payer: Self-pay

## 2019-01-19 NOTE — Telephone Encounter (Signed)
FMLA forms received. [address] of the [employer] [phone] Fax [phone]  Job duties attached

## 2019-01-20 ENCOUNTER — Telehealth: Payer: Self-pay | Admitting: Radiology

## 2019-01-20 NOTE — Telephone Encounter (Signed)
Enrolled patient for a 7 day Zio Long Term monitor to be mailed due to Covid-19. Patient understands instructions and knows to expect monitor in 3-4 days

## 2019-01-23 NOTE — Telephone Encounter (Signed)
Called pt and left VM to call the office. Need to know reason for FMLA.

## 2019-01-27 ENCOUNTER — Ambulatory Visit (INDEPENDENT_AMBULATORY_CARE_PROVIDER_SITE_OTHER): Payer: BLUE CROSS/BLUE SHIELD

## 2019-01-27 DIAGNOSIS — R002 Palpitations: Secondary | ICD-10-CM | POA: Diagnosis not present

## 2019-01-27 DIAGNOSIS — Z0279 Encounter for issue of other medical certificate: Secondary | ICD-10-CM

## 2019-01-27 NOTE — Telephone Encounter (Signed)
Forms completed and faxed.  

## 2019-02-15 DIAGNOSIS — Z319 Encounter for procreative management, unspecified: Secondary | ICD-10-CM | POA: Diagnosis not present

## 2019-02-15 DIAGNOSIS — E282 Polycystic ovarian syndrome: Secondary | ICD-10-CM | POA: Diagnosis not present

## 2019-02-16 ENCOUNTER — Encounter: Payer: Self-pay | Admitting: Family Medicine

## 2019-02-16 DIAGNOSIS — J4520 Mild intermittent asthma, uncomplicated: Secondary | ICD-10-CM

## 2019-02-17 MED ORDER — FLUTICASONE PROPIONATE HFA 110 MCG/ACT IN AERO: 2.0000 | INHALATION_SPRAY | Freq: Two times a day (BID) | RESPIRATORY_TRACT | 2 refills | Status: DC

## 2019-02-21 DIAGNOSIS — R002 Palpitations: Secondary | ICD-10-CM | POA: Diagnosis not present

## 2019-02-22 ENCOUNTER — Other Ambulatory Visit: Payer: Self-pay

## 2019-02-27 ENCOUNTER — Other Ambulatory Visit: Payer: Self-pay | Admitting: Family Medicine

## 2019-02-27 DIAGNOSIS — J4520 Mild intermittent asthma, uncomplicated: Secondary | ICD-10-CM

## 2019-03-02 NOTE — Progress Notes (Signed)
Virtual Visit via Video   Due to the COVID-19 pandemic, this visit was completed with telemedicine (audio/video) technology to reduce patient and provider exposure as well as to preserve personal protective equipment.   I connected with Stacey Morales by a video enabled telemedicine application and verified that I am speaking with the correct person using two identifiers. Location patient: Home Location provider: [facility] Persons participating in the virtual visit: Stacey Morales, Helane RimaErica Frances Joynt, DO [name], CMA acting as scribe for Dr. Helane RimaErica Hamdi Vari.   I discussed the limitations of evaluation and management by telemedicine and the availability of in person appointments. The patient expressed understanding and agreed to proceed.  Care Team   Patient Care Team: Helane RimaWallace, Breunna Nordmann, DO as PCP - General (Family Medicine) Jodelle Redhristopher, Bridgette, MD as PCP - Cardiology (Cardiology)  Subjective:   HPI: Patient started having off and on flank pain. On left side more than right. She has history of kidney sones and states that the pain is similar.  Pain can get to 5/10 and will improvement with position changes. Pain feels like she has been hit by something. Pain will last about 10 minutes. She denies any urinary symptoms or fevers. No radiation with pain at all  Hair loss: Started about a year ago. She thought that it was due to stress. She has had increased hair loss. It has started to increase over the last few weeks. She will increase her protein and healthy fats in her diet to make sure that she is getting all nutrients in her diet. She was instructed that hormone changes and stress may increase issues.    She has noticed that she has some pain in scapular region on both sides. Patient has had some radiation into b/l arms.   Fertility. They have recently added Actos to her medication. discussed symptoms to look for that may mean her blood sugars have dropped. She has actively  started trying to conceive in the last week.   Review of Systems  Constitutional: Negative for chills and fever.  HENT: Negative for hearing loss and tinnitus.   Eyes: Negative for blurred vision and double vision.  Respiratory: Negative for cough and hemoptysis.   Cardiovascular: Negative for chest pain and palpitations.  Gastrointestinal: Negative for nausea and vomiting.  Genitourinary: Negative for dysuria, frequency and urgency.  Musculoskeletal: Positive for back pain.  Skin: Negative for rash.  Neurological: Positive for headaches. Negative for dizziness.  Endo/Heme/Allergies: Does not bruise/bleed easily.  Psychiatric/Behavioral: Negative for depression and suicidal ideas.     Patient Active Problem List   Diagnosis Date Noted  . Migraine without aura and without status migrainosus, not intractable 09/28/2018  . Reactive airway disease 09/28/2018  . Nonintractable episodic headache 09/28/2018  . Dyspepsia 09/28/2018  . Situational mixed anxiety and depressive disorder 09/28/2018  . H. pylori infection 08/10/2018  . Type 2 diabetes mellitus with other specified complication (HCC) 08/10/2018  . Hyperlipidemia associated with type 2 diabetes mellitus (HCC) 08/10/2018  . Polycythemia 08/10/2018  . Female infertility 09/12/2017  . PCOS (polycystic ovarian syndrome), with polycystic ovaries 09/12/2017  . Vitamin D deficiency 09/12/2017  . Class 1 obesity with serious comorbidity and body mass index (BMI) of 31.0 to 31.9 in adult 07/01/2017    Social History   Tobacco Use  . Smoking status: Former Smoker    Years: 14.00    Types: Cigarettes    Last attempt to quit: 02/11/2017    Years since quitting: 2.0  .  Smokeless tobacco: Never Used  Substance Use Topics  . Alcohol use: Yes    Comment: occasionally     Current Outpatient Medications:  .  fluticasone (FLOVENT HFA) 110 MCG/ACT inhaler, Inhale 2 puffs into the lungs 2 (two) times daily., Disp: 1 Inhaler, Rfl: 2 .   folic acid (FOLVITE) 400 MCG tablet, Take 1 tablet (400 mcg total) by mouth daily., Disp: 90 tablet, Rfl: 0 .  letrozole (FEMARA) 2.5 MG tablet, , Disp: , Rfl:  .  medroxyPROGESTERone (PROVERA) 10 MG tablet, Take 10 mg by mouth daily., Disp: , Rfl:  .  metFORMIN (GLUCOPHAGE XR) 750 MG 24 hr tablet, Take 1 tablet (750 mg total) by mouth daily with breakfast., Disp: 90 tablet, Rfl: 2 .  pioglitazone (ACTOS) 30 MG tablet, Take 30 mg by mouth daily., Disp: , Rfl:  .  PROAIR HFA 108 (90 Base) MCG/ACT inhaler, INHALE 2 PUFFS INTO THE LUNGS EVERY 6 HOURS AS NEEDED FOR WHEEZING OR SHORTNESS OF BREATH, Disp: 8.5 g, Rfl: 0  No Known Allergies  Objective:   VITALS: Per patient if applicable, see vitals. GENERAL: Alert, appears well and in no acute distress. HEENT: Atraumatic, conjunctiva clear, no obvious abnormalities on inspection of external nose and ears. NECK: Normal movements of the head and neck. CARDIOPULMONARY: No increased WOB. Speaking in clear sentences. I:E ratio WNL.  MS: Moves all visible extremities without noticeable abnormality. PSYCH: Pleasant and cooperative, well-groomed. Speech normal rate and rhythm. Affect is appropriate. Insight and judgement are appropriate. Attention is focused, linear, and appropriate.  NEURO: CN grossly intact. Oriented as arrived to appointment on time with no prompting. Moves both UE equally.  SKIN: No obvious lesions, wounds, erythema, or cyanosis noted on face or hands.  Depression screen [facility] 2/9 09/28/2018 07/02/2017 07/01/2017  Decreased Interest 0 2 2  Down, Depressed, Hopeless 0 2 2  PHQ - 2 Score 0 4 4  Altered sleeping 0 0 3  Tired, decreased energy 1 2 3   Change in appetite 0 2 3  Feeling bad or failure about yourself  0 0 3  Trouble concentrating 0 0 3  Moving slowly or fidgety/restless 0 0 0  Suicidal thoughts 0 0 0  PHQ-9 Score 1 8 19   Difficult doing work/chores Not difficult at all Somewhat difficult Somewhat difficult    Assessment  and Plan:   Stacey Morales was seen today for back pain.  Diagnoses and all orders for this visit:  Flank pain -     US Renal; Future  Telogen effluvium  Type 2 diabetes mellitus with other specified complication, without long-term current use of insulin (HCC)  PCOS (polycystic ovarian syndrome), with polycystic ovaries  Female infertility  Personal history of kidney stones  . COVID-19 Education: The signs and symptoms of COVID-19 were discussed with the patient and how to seek care for testing if needed. The importance of social distancing was discussed today. . Reviewed expectations re: course of current medical issues. . Discussed self-management of symptoms. . Outlined signs and symptoms indicating need for more acute intervention. . Patient verbalized understanding and all questions were answered. Marland Kitchen Health Maintenance issues including appropriate healthy diet, exercise, and smoking avoidance were discussed with patient. . See orders for this visit as documented in the electronic medical record.  Helane Rima, DO  Records requested if needed. Time spent: 25 minutes, of which >50% was spent in obtaining information about her symptoms, reviewing her previous labs, evaluations, and treatments, counseling her about her condition (please see  the discussed topics above), and developing a plan to further investigate it; she had a number of questions which I addressed.

## 2019-03-03 ENCOUNTER — Encounter: Payer: Self-pay | Admitting: Family Medicine

## 2019-03-03 ENCOUNTER — Ambulatory Visit (HOSPITAL_COMMUNITY): Admission: RE | Admit: 2019-03-03 | Payer: BLUE CROSS/BLUE SHIELD | Source: Ambulatory Visit

## 2019-03-03 ENCOUNTER — Other Ambulatory Visit: Payer: Self-pay

## 2019-03-03 ENCOUNTER — Ambulatory Visit (INDEPENDENT_AMBULATORY_CARE_PROVIDER_SITE_OTHER): Payer: BLUE CROSS/BLUE SHIELD | Admitting: Family Medicine

## 2019-03-03 VITALS — Ht 61.0 in | Wt 157.0 lb

## 2019-03-03 DIAGNOSIS — E1169 Type 2 diabetes mellitus with other specified complication: Secondary | ICD-10-CM

## 2019-03-03 DIAGNOSIS — Z87442 Personal history of urinary calculi: Secondary | ICD-10-CM

## 2019-03-03 DIAGNOSIS — N979 Female infertility, unspecified: Secondary | ICD-10-CM

## 2019-03-03 DIAGNOSIS — R109 Unspecified abdominal pain: Secondary | ICD-10-CM

## 2019-03-03 DIAGNOSIS — L650 Telogen effluvium: Secondary | ICD-10-CM | POA: Diagnosis not present

## 2019-03-03 DIAGNOSIS — E282 Polycystic ovarian syndrome: Secondary | ICD-10-CM | POA: Diagnosis not present

## 2019-03-03 NOTE — Patient Instructions (Signed)
Cervical Strain and Sprain Rehab Ask your health care provider which exercises are safe for you. Do exercises exactly as told by your health care provider and adjust them as directed. It is normal to feel mild stretching, pulling, tightness, or discomfort as you do these exercises, but you should stop right away if you feel sudden pain or your pain gets worse.Do not begin these exercises until told by your health care provider. Stretching and range of motion exercises These exercises warm up your muscles and joints and improve the movement and flexibility of your neck. These exercises also help to relieve pain, numbness, and tingling. Exercise A: Cervical side bend  1. Using good posture, sit on a stable chair or stand up. 2. Without moving your shoulders, slowly tilt your left / right ear to your shoulder until you feel a stretch in your neck muscles. You should be looking straight ahead. 3. Hold for __________ seconds. 4. Repeat with the other side of your neck. Repeat __________ times. Complete this exercise __________ times a day. Exercise B: Cervical rotation  1. Using good posture, sit on a stable chair or stand up. 2. Slowly turn your head to the side as if you are looking over your left / right shoulder. ? Keep your eyes level with the ground. ? Stop when you feel a stretch along the side and the back of your neck. 3. Hold for __________ seconds. 4. Repeat this by turning to your other side. Repeat __________ times. Complete this exercise __________ times a day. Exercise C: Thoracic extension and pectoral stretch 1. Roll a towel or a small blanket so it is about 4 inches (10 cm) in diameter. 2. Lie down on your back on a firm surface. 3. Put the towel lengthwise, under your spine in the middle of your back. It should not be not under your shoulder blades. The towel should line up with your spine from your middle back to your lower back. 4. Put your hands behind your head and let your  elbows fall out to your sides. 5. Hold for __________ seconds. Repeat __________ times. Complete this exercise __________ times a day. Strengthening exercises These exercises build strength and endurance in your neck. Endurance is the ability to use your muscles for a long time, even after your muscles get tired. Exercise D: Upper cervical flexion, isometric 1. Lie on your back with a thin pillow behind your head and a small rolled-up towel under your neck. 2. Gently tuck your chin toward your chest and nod your head down to look toward your feet. Do not lift your head off the pillow. 3. Hold for __________ seconds. 4. Release the tension slowly. Relax your neck muscles completely before you repeat this exercise. Repeat __________ times. Complete this exercise __________ times a day. Exercise E: Cervical extension, isometric  1. Stand about 6 inches (15 cm) away from a wall, with your back facing the wall. 2. Place a soft object, about 6-8 inches (15-20 cm) in diameter, between the back of your head and the wall. A soft object could be a small pillow, a ball, or a folded towel. 3. Gently tilt your head back and press into the soft object. Keep your jaw and forehead relaxed. 4. Hold for __________ seconds. 5. Release the tension slowly. Relax your neck muscles completely before you repeat this exercise. Repeat __________ times. Complete this exercise __________ times a day. Posture and body mechanics Body mechanics refers to the movements and positions of your   body while you do your daily activities. Posture is part of body mechanics. Good posture and healthy body mechanics can help to relieve stress in your body's tissues and joints. Good posture means that your spine is in its natural S-curve position (your spine is neutral), your shoulders are pulled back slightly, and your head is not tipped forward. The following are general guidelines for applying improved posture and body mechanics to your  everyday activities. Standing   When standing, keep your spine neutral and keep your feet about hip-width apart. Keep a slight bend in your knees. Your ears, shoulders, and hips should line up.  When you do a task in which you stand in one place for a long time, place one foot up on a stable object that is 2-4 inches (5-10 cm) high, such as a footstool. This helps keep your spine neutral. Sitting   When sitting, keep your spine neutral and your keep feet flat on the floor. Use a footrest, if necessary, and keep your thighs parallel to the floor. Avoid rounding your shoulders, and avoid tilting your head forward.  When working at a desk or a computer, keep your desk at a height where your hands are slightly lower than your elbows. Slide your chair under your desk so you are close enough to maintain good posture.  When working at a computer, place your monitor at a height where you are looking straight ahead and you do not have to tilt your head forward or downward to look at the screen. Resting When lying down and resting, avoid positions that are most painful for you. Try to support your neck in a neutral position. You can use a contour pillow or a small rolled-up towel. Your pillow should support your neck but not push on it. This information is not intended to replace advice given to you by your health care provider. Make sure you discuss any questions you have with your health care provider. Document Released: 09/28/2005 Document Revised: 06/04/2016 Document Reviewed: 09/04/2015 [employer] Interactive Patient Education  2019 [employer]  

## 2019-03-04 ENCOUNTER — Encounter: Payer: Self-pay | Admitting: Family Medicine

## 2019-03-07 ENCOUNTER — Other Ambulatory Visit: Payer: BLUE CROSS/BLUE SHIELD

## 2019-05-14 NOTE — Progress Notes (Signed)
Virtual Visit via Video   Due to the COVID-19 pandemic, this visit was completed with telemedicine (audio/video) technology to reduce patient and provider exposure as well as to preserve personal protective equipment.   I connected with Stacey Morales by a video enabled telemedicine application and verified that I am speaking with the correct person using two identifiers. Location patient: Home Location provider:  [facility], Office Persons participating in the virtual visit: Stacey Morales, Helane RimaErica Yousuf Ager, DO   I discussed the limitations of evaluation and management by telemedicine and the availability of in person appointments. The patient expressed understanding and agreed to proceed.  Care Team   Patient Care Team: Helane RimaWallace, Eris Hannan, DO as PCP - General (Family Medicine) Jodelle Redhristopher, Bridgette, MD as PCP - Cardiology (Cardiology)  Subjective:   HPI: Patient with history of diabetes, PCOS, and infertility.  Still not successful conceiving.  She has decided to take a break for the month.  Currently on metformin only.  She is interested in starting a GLP-1 receptor agonist again.  Previously had site injection issues.  Discussed Rybelsus.  Patient is struggling emotionally through this time.  She is interested in seeing a therapist if possible.  Review of Systems  Constitutional: Negative for chills, fever, malaise/fatigue and weight loss.  Respiratory: Negative for cough, shortness of breath and wheezing.   Cardiovascular: Negative for chest pain, palpitations and leg swelling.  Gastrointestinal: Negative for abdominal pain, constipation, diarrhea, nausea and vomiting.  Genitourinary: Negative for dysuria and urgency.  Musculoskeletal: Negative for joint pain and myalgias.  Skin: Negative for rash.  Neurological: Negative for dizziness and headaches.  Psychiatric/Behavioral: Positive for depression. Negative for substance abuse and suicidal ideas. The patient is nervous/anxious.     Patient Active Problem List   Diagnosis Date Noted  . Migraine without aura and without status migrainosus, not intractable 09/28/2018  . Reactive airway disease 09/28/2018  . Nonintractable episodic headache 09/28/2018  . Dyspepsia 09/28/2018  . Situational mixed anxiety and depressive disorder 09/28/2018  . H. pylori infection 08/10/2018  . Type 2 diabetes mellitus with other specified complication (HCC) 08/10/2018  . Hyperlipidemia associated with type 2 diabetes mellitus (HCC) 08/10/2018  . Polycythemia 08/10/2018  . Female infertility 09/12/2017  . PCOS (polycystic ovarian syndrome), with polycystic ovaries 09/12/2017  . Vitamin D deficiency 09/12/2017  . Class 1 obesity with serious comorbidity and body mass index (BMI) of 31.0 to 31.9 in adult 07/01/2017    Social History   Tobacco Use  . Smoking status: Former Smoker    Years: 14.00    Types: Cigarettes    Quit date: 02/11/2017    Years since quitting: 2.2  . Smokeless tobacco: Never Used  Substance Use Topics  . Alcohol use: Yes    Comment: occasionally    Current Outpatient Medications:  .  fluticasone (FLOVENT HFA) 110 MCG/ACT inhaler, Inhale 2 puffs into the lungs 2 (two) times daily., Disp: 1 Inhaler, Rfl: 2 .  folic acid (FOLVITE) 400 MCG tablet, Take 1 tablet (400 mcg total) by mouth daily., Disp: 90 tablet, Rfl: 0 .  medroxyPROGESTERone (PROVERA) 10 MG tablet, Take 10 mg by mouth daily., Disp: , Rfl:  .  metFORMIN (GLUCOPHAGE XR) 750 MG 24 hr tablet, Take 1 tablet (750 mg total) by mouth daily with breakfast., Disp: 90 tablet, Rfl: 2  No Known Allergies  Objective:   VITALS: Per patient if applicable, see vitals. GENERAL: Alert, appears well and in no acute distress. HEENT: Atraumatic, conjunctiva clear, no  obvious abnormalities on inspection of external nose and ears. NECK: Normal movements of the head and neck. CARDIOPULMONARY: No increased WOB. Speaking in clear sentences. I:E ratio WNL.  MS: Moves all  visible extremities without noticeable abnormality. PSYCH: Pleasant and cooperative, well-groomed. Speech normal rate and rhythm. Affect is appropriate. Insight and judgement are appropriate. Attention is focused, linear, and appropriate.  NEURO: CN grossly intact. Oriented as arrived to appointment on time with no prompting. Moves both UE equally.  SKIN: No obvious lesions, wounds, erythema, or cyanosis noted on face or hands.  Depression screen [facility] 2/9 05/15/2019 09/28/2018 07/02/2017  Decreased Interest 0 0 2  Down, Depressed, Hopeless 0 0 2  PHQ - 2 Score 0 0 4  Altered sleeping 0 0 0  Tired, decreased energy 1 1 2   Change in appetite 0 0 2  Feeling bad or failure about yourself  0 0 0  Trouble concentrating 0 0 0  Moving slowly or fidgety/restless 0 0 0  Suicidal thoughts 0 0 0  PHQ-9 Score 1 1 8   Difficult doing work/chores Not difficult at all Not difficult at all Somewhat difficult    Assessment and Plan:   Stacey Morales was seen today for follow-up and itching of both ears and throat.  Diagnoses and all orders for this visit:  Type 2 diabetes mellitus with other specified complication, without long-term current use of insulin ([place]) -     CBC with Differential/Platelet; Future -     Hemoglobin A1c; Future -     Semaglutide (RYBELSUS) 3 MG TABS; Take 3 mg by mouth daily.  PCOS (polycystic ovarian syndrome), with polycystic ovaries  Hyperlipidemia associated with type 2 diabetes mellitus ([place]) -     Comprehensive metabolic panel; Future -     Lipid panel; Future  Class 1 obesity due to excess calories with serious comorbidity and body mass index (BMI) of 31.0 to 31.9 in adult  Medication management, needs B12 checked while on Metformin -     CBC with Differential/Platelet; Future -     Vitamin B12; Future  Situational depression Comments: Therapy recommended.     Marland Kitchen COVID-19 Education: The signs and symptoms of COVID-19 were discussed with the patient and how to seek care  for testing if needed. The importance of social distancing was discussed today. . Reviewed expectations re: course of current medical issues. . Discussed self-management of symptoms. . Outlined signs and symptoms indicating need for more acute intervention. . Patient verbalized understanding and all questions were answered. Marland Kitchen Health Maintenance issues including appropriate healthy diet, exercise, and smoking avoidance were discussed with patient. . See orders for this visit as documented in the electronic medical record.  Briscoe Deutscher, DO  Records requested if needed. Time spent: 25 minutes, of which >50% was spent in obtaining information about her symptoms, reviewing her previous labs, evaluations, and treatments, counseling her about her condition (please see the discussed topics above), and developing a plan to further investigate it; she had a number of questions which I addressed.

## 2019-05-15 ENCOUNTER — Other Ambulatory Visit: Payer: Self-pay

## 2019-05-15 ENCOUNTER — Ambulatory Visit (INDEPENDENT_AMBULATORY_CARE_PROVIDER_SITE_OTHER): Payer: BC Managed Care – PPO | Admitting: Family Medicine

## 2019-05-15 ENCOUNTER — Encounter: Payer: Self-pay | Admitting: Family Medicine

## 2019-05-15 VITALS — Ht 61.0 in

## 2019-05-15 DIAGNOSIS — E282 Polycystic ovarian syndrome: Secondary | ICD-10-CM

## 2019-05-15 DIAGNOSIS — Z79899 Other long term (current) drug therapy: Secondary | ICD-10-CM | POA: Diagnosis not present

## 2019-05-15 DIAGNOSIS — E785 Hyperlipidemia, unspecified: Secondary | ICD-10-CM

## 2019-05-15 DIAGNOSIS — E1169 Type 2 diabetes mellitus with other specified complication: Secondary | ICD-10-CM | POA: Diagnosis not present

## 2019-05-15 DIAGNOSIS — F4321 Adjustment disorder with depressed mood: Secondary | ICD-10-CM

## 2019-05-15 DIAGNOSIS — E6609 Other obesity due to excess calories: Secondary | ICD-10-CM

## 2019-05-15 DIAGNOSIS — Z6831 Body mass index (BMI) 31.0-31.9, adult: Secondary | ICD-10-CM

## 2019-05-15 MED ORDER — RYBELSUS 3 MG PO TABS: 3.0000 mg | ORAL_TABLET | Freq: Every day | ORAL | 1 refills | Status: DC

## 2019-05-15 NOTE — Patient Instructions (Addendum)
It was really great to see your smiling face today!  Hang in there.  For chronic constipation, I want you to continue with the fiber.  There is a tea called SMOOTH MOVE TEA that I'd like for you to pick up the grocery store.  Drink this every 2 to 3 days to help clean your system.  Let me know if you have any questions or concerns about this.  I will gather a few names for therapists and send this information to your MyChart.  Someone from our team will call you to arrange for a lab visit and we will make sure that the medication sample - Rybelsus - is available.   GETTING TO GOOD BOWEL HEALTH  Irregular bowel habits such as constipation can lead to many problems over time.  Having one soft bowel movement a day is the most important way to prevent further problems.    The goal: ONE SOFT BOWEL MOVEMENT A DAY!  To have soft, regular bowel movements:  . Drink at least 8 tall glasses of water a day.   . Take plenty of fiber.  Fiber is the undigested part of plant food that passes into the colon, acting s "natures broom" to encourage bowel motility and movement.  Fiber can absorb and hold large amounts of water. This results in a larger, bulkier stool, which is soft and easier to pass. Work gradually over several weeks up to 6 servings a day of fiber (25g a day even more if needed) in the form of: o Vegetables -- Root (potatoes, carrots, turnips), leafy green (lettuce, salad greens, celery, spinach), or cooked high residue (cabbage, broccoli, etc) o Fruit -- Fresh (unpeeled skin & pulp), Dried (prunes, apricots, cherries, etc ),  or stewed ( applesauce)  o Whole grain breads, pasta, etc (whole wheat)  o Bran cereals  . Bulking Agents -- This type of water-retaining fiber generally is easily obtained each day by one of the following:  o Psyllium bran -- The psyllium plant is remarkable because its ground seeds can retain so much water. This product is available as Metamucil, Konsyl, Effersyllium, Per  Diem Fiber, or the less expensive generic preparation in drug and health food stores. Although labeled a laxative, it really is not a laxative.  o Methylcellulose -- This is another fiber derived from wood which also retains water. It is available as Citrucel. o Polyethylene Glycol - and "artificial" fiber commonly called Miralax or Glycolax.  It is helpful for people with gassy or bloated feelings with regular fiber o Flax Seed - a less gassy fiber than psyllium . No reading or other relaxing activity while on the toilet. If bowel movements take longer than 5 minutes, you are too constipated. . AVOID CONSTIPATION.  High fiber and water intake usually takes care of this.  Sometimes a laxative is needed to stimulate more frequent bowel movements, but  . Laxatives are not a good long-term solution as it can wear the colon out. o Osmotics (Milk of Magnesia, Fleets phosphosoda, Magnesium citrate, MiraLax, GoLytely) are safer than  o Stimulants (Senokot, Castor Oil, Dulcolax, Ex Lax)    o Do not take laxatives for more than 7days in a row. .  IF SEVERELY CONSTIPATED, try a Bowel Retraining Program: o Do not use laxatives.  o Eat a diet high in roughage, such as bran cereals and leafy vegetables.  o Drink six (6) ounces of prune or apricot juice each morning.  o Eat two (2) large servings  of stewed fruit each day.  o Take one (1) heaping tablespoon of a psyllium-based bulking agent twice a day. Use sugar-free sweetener when possible to avoid excessive calories.  o Eat a normal breakfast.  o Set aside 15 minutes after breakfast to sit on the toilet, but do not strain to have a bowel movement.  o If you do not have a bowel movement by the third day, use an enema and repeat the above steps.

## 2019-05-18 ENCOUNTER — Other Ambulatory Visit (INDEPENDENT_AMBULATORY_CARE_PROVIDER_SITE_OTHER): Payer: BC Managed Care – PPO

## 2019-05-18 ENCOUNTER — Other Ambulatory Visit: Payer: Self-pay | Admitting: Family Medicine

## 2019-05-18 ENCOUNTER — Other Ambulatory Visit: Payer: Self-pay

## 2019-05-18 DIAGNOSIS — E785 Hyperlipidemia, unspecified: Secondary | ICD-10-CM

## 2019-05-18 DIAGNOSIS — E1169 Type 2 diabetes mellitus with other specified complication: Secondary | ICD-10-CM

## 2019-05-18 DIAGNOSIS — Z79899 Other long term (current) drug therapy: Secondary | ICD-10-CM

## 2019-05-18 LAB — COMPREHENSIVE METABOLIC PANEL
ALT: 39 U/L — ABNORMAL HIGH (ref 0–35)
AST: 24 U/L (ref 0–37)
Albumin: 4.5 g/dL (ref 3.5–5.2)
Alkaline Phosphatase: 63 U/L (ref 39–117)
BUN: 12 mg/dL (ref 6–23)
CO2: 26 mEq/L (ref 19–32)
Calcium: 9.8 mg/dL (ref 8.4–10.5)
Chloride: 103 mEq/L (ref 96–112)
Creatinine, Ser: 0.9 mg/dL (ref 0.40–1.20)
GFR: 72.95 mL/min (ref >60.00)
Glucose, Bld: 124 mg/dL — ABNORMAL HIGH (ref 70–99)
Potassium: 4.4 mEq/L (ref 3.5–5.1)
Sodium: 137 mEq/L (ref 135–145)
Total Bilirubin: 0.5 mg/dL (ref 0.2–1.2)
Total Protein: 7.3 g/dL (ref 6.0–8.3)

## 2019-05-18 LAB — CBC WITH DIFFERENTIAL/PLATELET
Basophils Absolute: 0.1 10*3/uL (ref 0.0–0.1)
Basophils Relative: 0.7 % (ref 0.0–3.0)
Eosinophils Absolute: 0.1 10*3/uL (ref 0.0–0.7)
Eosinophils Relative: 1.5 % (ref 0.0–5.0)
HCT: 43.8 % (ref 36.0–46.0)
Hemoglobin: 14.9 g/dL (ref 12.0–15.0)
Lymphocytes Relative: 24.7 % (ref 12.0–46.0)
Lymphs Abs: 2.4 10*3/uL (ref 0.7–4.0)
MCHC: 34 g/dL (ref 30.0–36.0)
MCV: 93.7 fl (ref 78.0–100.0)
Monocytes Absolute: 0.6 10*3/uL (ref 0.1–1.0)
Monocytes Relative: 6 % (ref 3.0–12.0)
Neutro Abs: 6.6 10*3/uL (ref 1.4–7.7)
Neutrophils Relative %: 67.1 % (ref 43.0–77.0)
Platelets: 285 10*3/uL (ref 150.0–400.0)
RBC: 4.68 Mil/uL (ref 3.87–5.11)
RDW: 12.8 % (ref 11.5–15.5)
WBC: 9.8 10*3/uL (ref 4.0–10.5)

## 2019-05-18 LAB — LIPID PANEL
Cholesterol: 207 mg/dL — ABNORMAL HIGH (ref 0–200)
HDL: 48.9 mg/dL (ref >39.00)
LDL Cholesterol: 124 mg/dL — ABNORMAL HIGH (ref 0–99)
NonHDL: 158.38
Total CHOL/HDL Ratio: 4
Triglycerides: 170 mg/dL — ABNORMAL HIGH (ref 0.0–149.0)
VLDL: 34 mg/dL (ref 0.0–40.0)

## 2019-05-18 LAB — VITAMIN B12: Vitamin B-12: 241 pg/mL (ref 211–911)

## 2019-05-18 LAB — HEMOGLOBIN A1C: Hgb A1c MFr Bld: 5.3 % (ref 4.6–6.5)

## 2019-06-05 DIAGNOSIS — L218 Other seborrheic dermatitis: Secondary | ICD-10-CM | POA: Diagnosis not present

## 2019-07-02 IMAGING — US US ABDOMEN LIMITED
1 series · 14 of 25 positions shown · non-contrast
Comparison: CT 09/14/2016.

CLINICAL DATA: Elevated LFTs.

EXAM:
ULTRASOUND ABDOMEN LIMITED RIGHT UPPER QUADRANT

[Series 1: us abdomen limited · 14 of 56 slices shown]
[im 1/56]
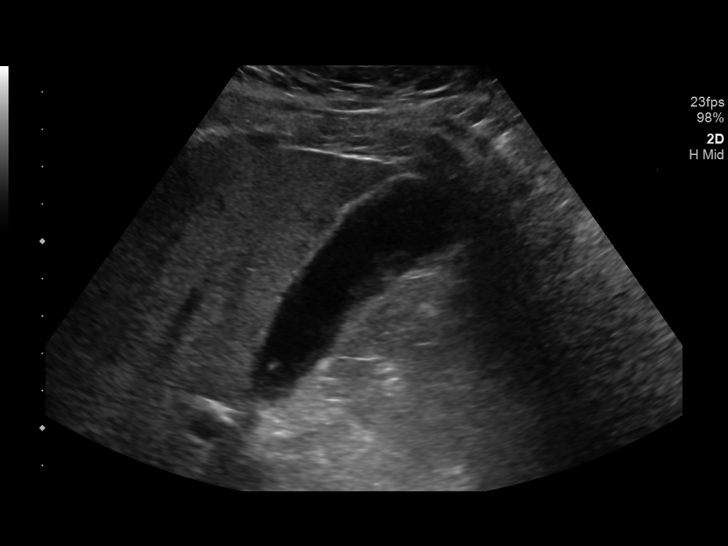
[im 5/56]
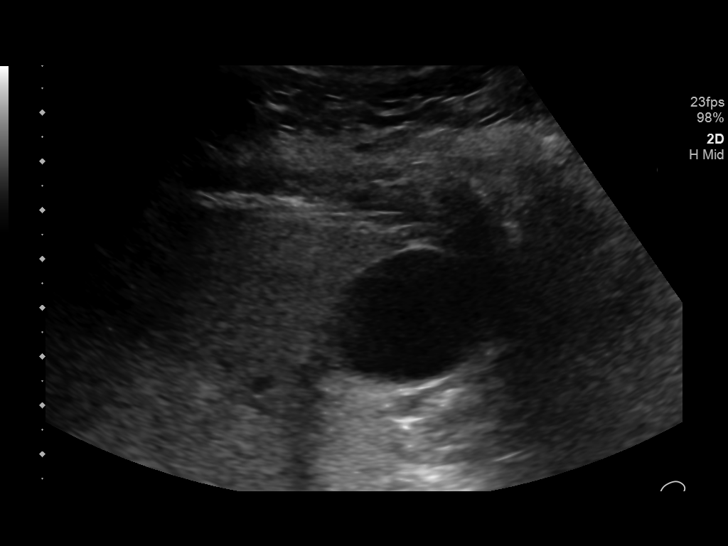
[im 10/56]
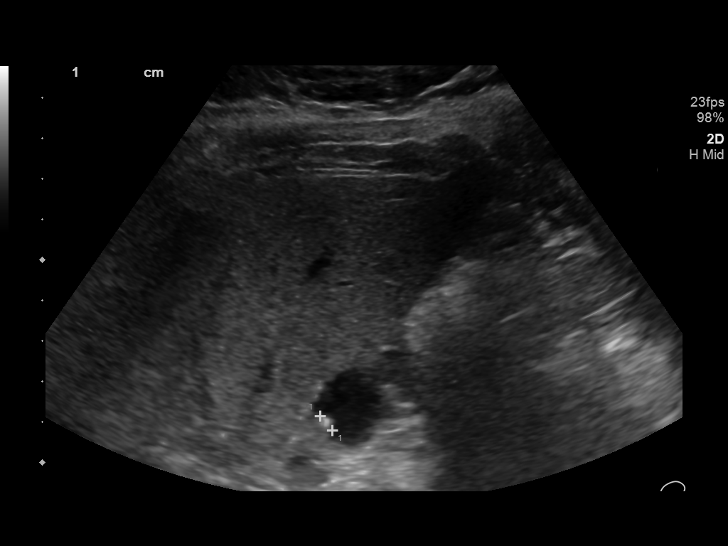
[im 14/56]
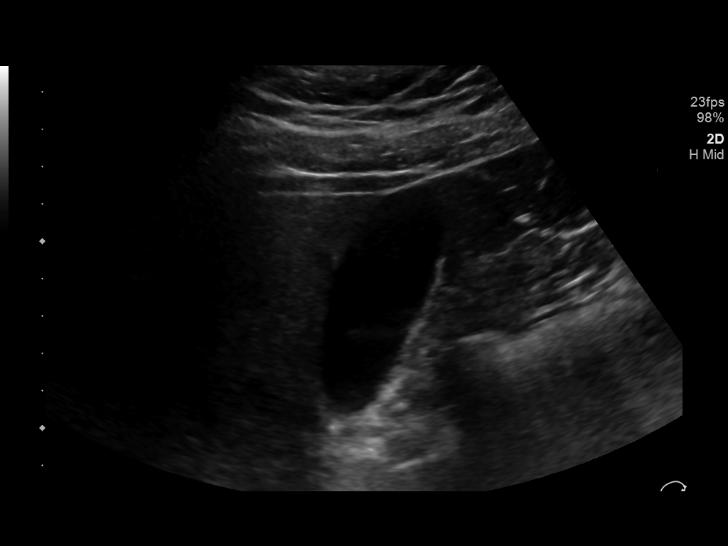
[im 19/56]
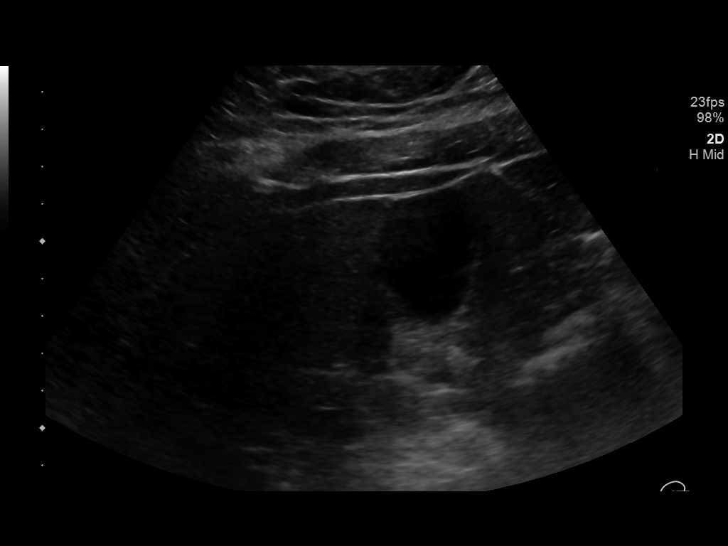
[im 21/56]
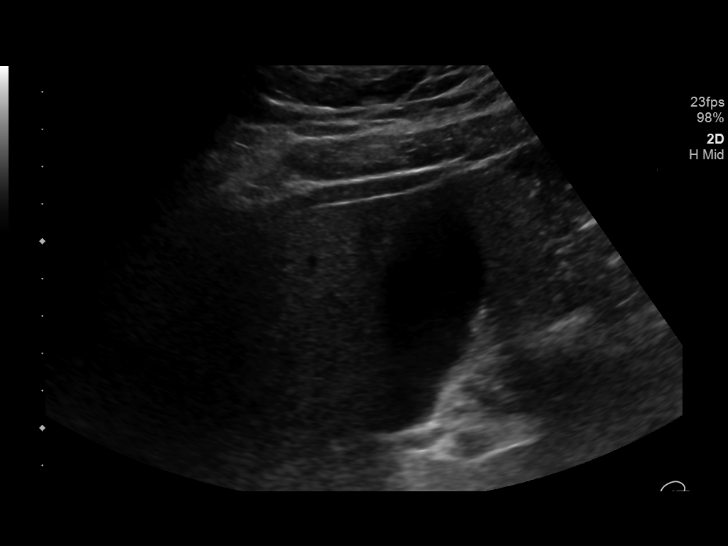
[im 26/56]
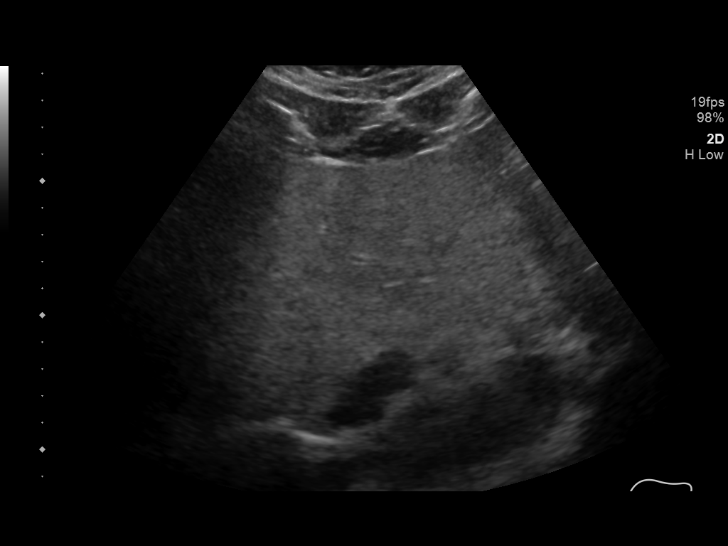
[im 30/56]
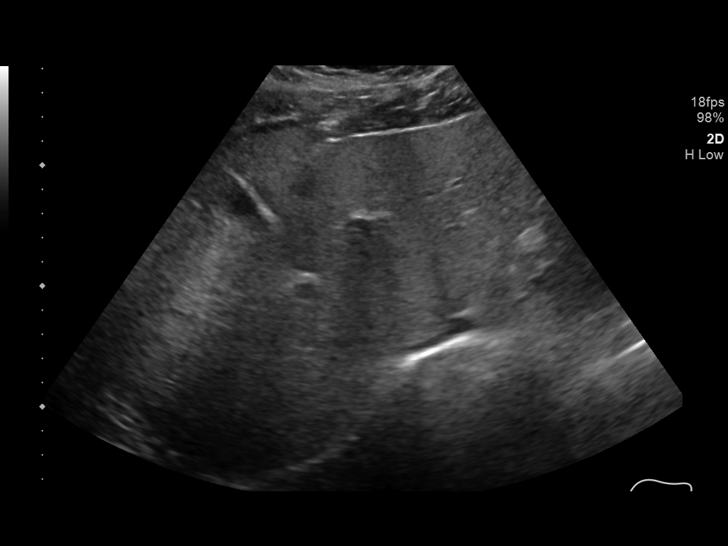
[im 35/56]
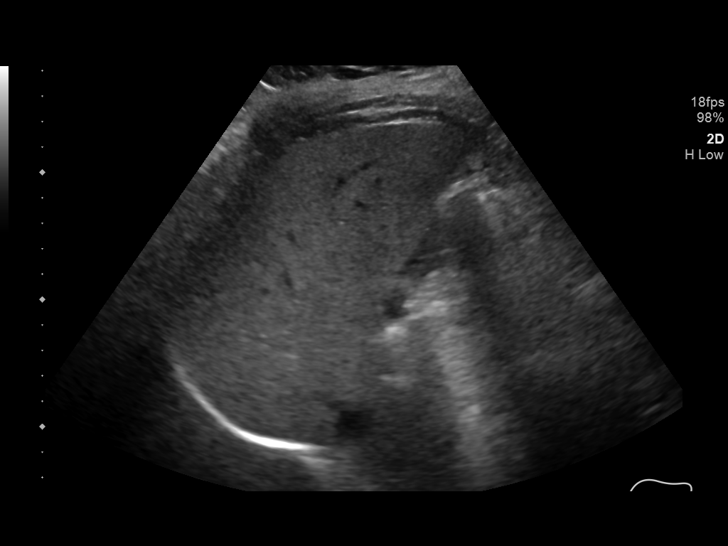
[im 37/56]
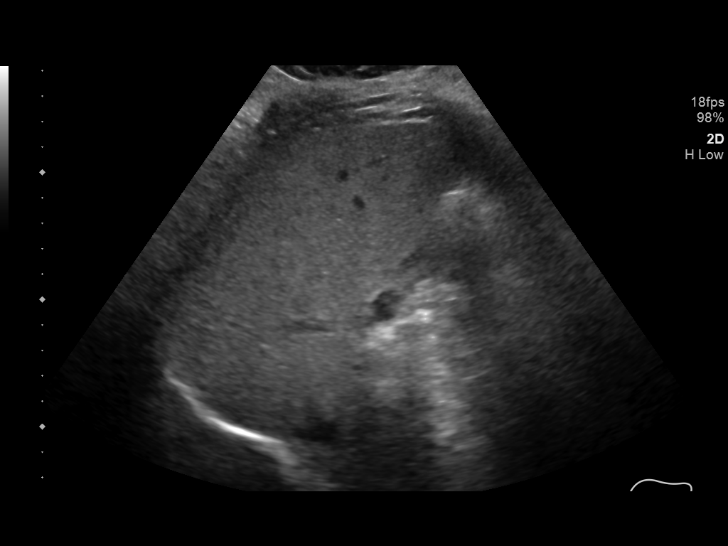
[im 42/56]
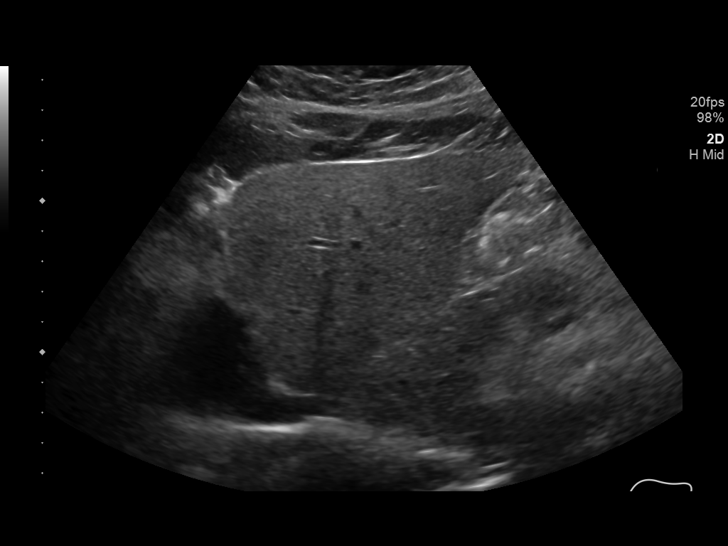
[im 46/56]
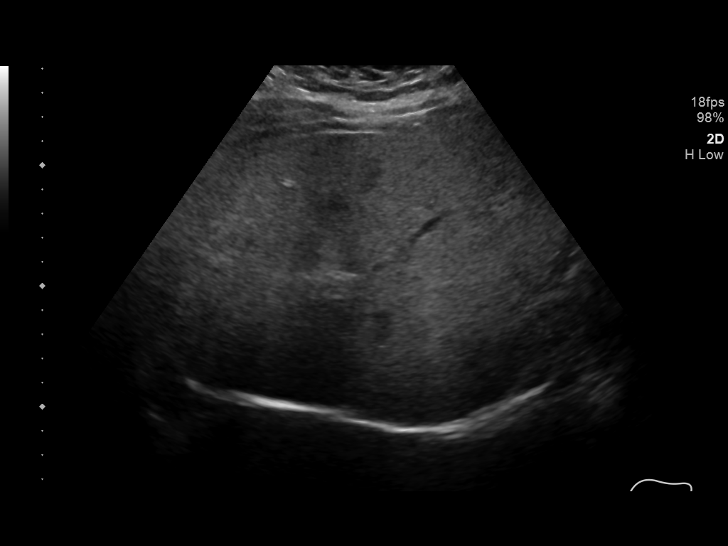
[im 51/56]
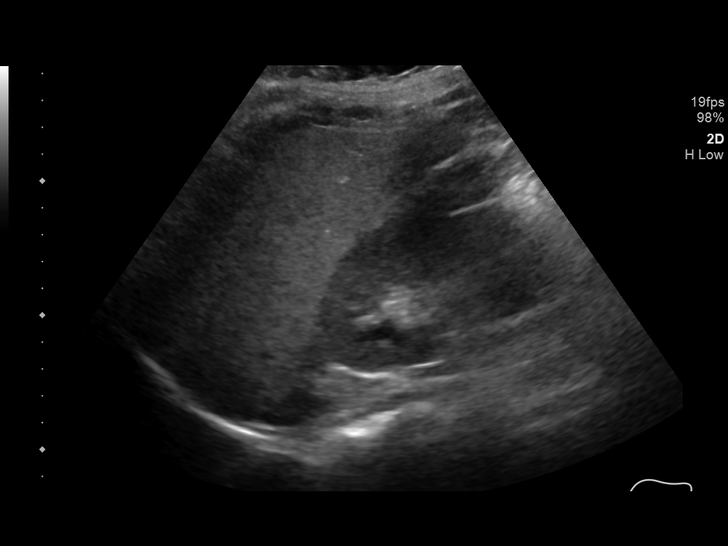
[im 56/56]
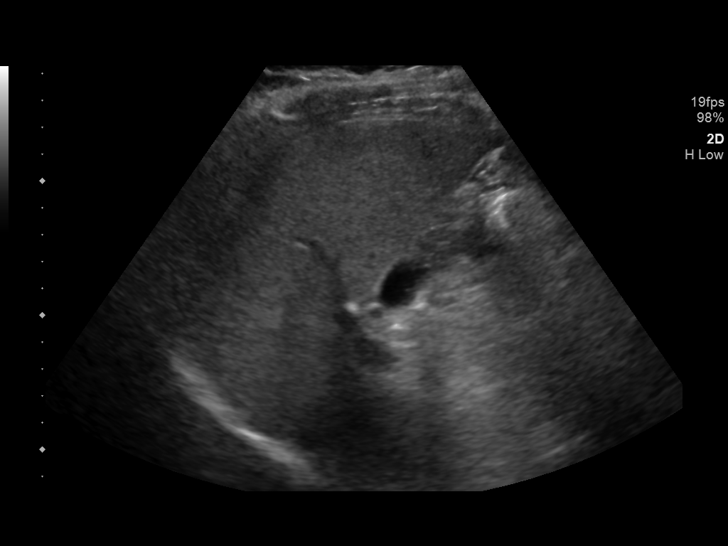

[14 of 25 positions shown; findings below may reference images not displayed]

FINDINGS: Gallbladder:

5 mm polyp noted. Mild amount of sludge can not be completely
excluded. No gallstones. Gallbladder wall thickness normal. Negative
Murphy sign.

Common bile duct:

Diameter: 4.6 mm.

Liver:

No focal lesion identified. Within normal limits in parenchymal
echogenicity. Portal vein is patent on color Doppler imaging with
normal direction of blood flow towards the liver.
IMPRESSION: 5 mm gallbladder polyp noted. Mild amount of sludge can Od be
excluded. No gallstones or biliary distention noted.

## 2019-07-19 DIAGNOSIS — Z23 Encounter for immunization: Secondary | ICD-10-CM | POA: Diagnosis not present

## 2019-07-26 ENCOUNTER — Other Ambulatory Visit: Payer: Self-pay

## 2019-07-26 DIAGNOSIS — J4520 Mild intermittent asthma, uncomplicated: Secondary | ICD-10-CM

## 2019-07-26 DIAGNOSIS — E1169 Type 2 diabetes mellitus with other specified complication: Secondary | ICD-10-CM

## 2019-07-26 MED ORDER — ALBUTEROL SULFATE HFA 108 (90 BASE) MCG/ACT IN AERS: INHALATION_SPRAY | RESPIRATORY_TRACT | 0 refills | Status: DC

## 2019-07-26 MED ORDER — FLOVENT HFA 110 MCG/ACT IN AERO: 2.0000 | INHALATION_SPRAY | Freq: Two times a day (BID) | RESPIRATORY_TRACT | 2 refills | Status: DC

## 2019-07-26 MED ORDER — RYBELSUS 3 MG PO TABS: 7.0000 mg | ORAL_TABLET | Freq: Every day | ORAL | 1 refills | Status: DC

## 2019-07-26 MED ORDER — METFORMIN HCL ER 750 MG PO TB24: 750.0000 mg | ORAL_TABLET | Freq: Every day | ORAL | 2 refills | Status: DC

## 2019-08-03 DIAGNOSIS — J029 Acute pharyngitis, unspecified: Secondary | ICD-10-CM | POA: Diagnosis not present

## 2019-08-03 DIAGNOSIS — T7840XA Allergy, unspecified, initial encounter: Secondary | ICD-10-CM | POA: Diagnosis not present

## 2019-08-24 ENCOUNTER — Other Ambulatory Visit: Payer: Self-pay | Admitting: Family Medicine

## 2019-08-24 DIAGNOSIS — J4520 Mild intermittent asthma, uncomplicated: Secondary | ICD-10-CM

## 2019-09-12 DIAGNOSIS — Z20828 Contact with and (suspected) exposure to other viral communicable diseases: Secondary | ICD-10-CM | POA: Diagnosis not present

## 2019-09-21 ENCOUNTER — Other Ambulatory Visit: Payer: Self-pay | Admitting: Family Medicine

## 2019-09-21 DIAGNOSIS — J4520 Mild intermittent asthma, uncomplicated: Secondary | ICD-10-CM

## 2019-09-28 ENCOUNTER — Other Ambulatory Visit: Payer: Self-pay | Admitting: Family Medicine

## 2019-09-28 DIAGNOSIS — E1169 Type 2 diabetes mellitus with other specified complication: Secondary | ICD-10-CM

## 2019-10-16 ENCOUNTER — Other Ambulatory Visit: Payer: Self-pay

## 2019-10-16 DIAGNOSIS — E1169 Type 2 diabetes mellitus with other specified complication: Secondary | ICD-10-CM

## 2019-10-16 MED ORDER — RYBELSUS 3 MG PO TABS: 1.0000 | ORAL_TABLET | Freq: Every day | ORAL | 0 refills | Status: DC

## 2019-11-29 DIAGNOSIS — Z20828 Contact with and (suspected) exposure to other viral communicable diseases: Secondary | ICD-10-CM | POA: Diagnosis not present

## 2020-01-05 DIAGNOSIS — K5900 Constipation, unspecified: Secondary | ICD-10-CM | POA: Diagnosis not present

## 2020-01-05 DIAGNOSIS — E1165 Type 2 diabetes mellitus with hyperglycemia: Secondary | ICD-10-CM | POA: Diagnosis not present

## 2020-01-05 DIAGNOSIS — G5601 Carpal tunnel syndrome, right upper limb: Secondary | ICD-10-CM | POA: Diagnosis not present

## 2020-01-05 DIAGNOSIS — G5602 Carpal tunnel syndrome, left upper limb: Secondary | ICD-10-CM | POA: Diagnosis not present

## 2020-01-23 ENCOUNTER — Emergency Department (HOSPITAL_COMMUNITY)
Admission: EM | Admit: 2020-01-23 | Discharge: 2020-01-23 | Disposition: A | Discharge status: Home / Self Care | Payer: BC Managed Care – PPO | Attending: Emergency Medicine | Admitting: Emergency Medicine

## 2020-01-23 DIAGNOSIS — E119 Type 2 diabetes mellitus without complications: Secondary | ICD-10-CM | POA: Diagnosis not present

## 2020-01-23 DIAGNOSIS — Z87442 Personal history of urinary calculi: Secondary | ICD-10-CM | POA: Diagnosis not present

## 2020-01-23 DIAGNOSIS — R319 Hematuria, unspecified: Secondary | ICD-10-CM | POA: Insufficient documentation

## 2020-01-23 DIAGNOSIS — R110 Nausea: Secondary | ICD-10-CM | POA: Diagnosis not present

## 2020-01-23 DIAGNOSIS — R1032 Left lower quadrant pain: Secondary | ICD-10-CM | POA: Diagnosis not present

## 2020-01-23 DIAGNOSIS — Z87891 Personal history of nicotine dependence: Secondary | ICD-10-CM | POA: Insufficient documentation

## 2020-01-23 DIAGNOSIS — R109 Unspecified abdominal pain: Secondary | ICD-10-CM

## 2020-01-23 DIAGNOSIS — Z7984 Long term (current) use of oral hypoglycemic drugs: Secondary | ICD-10-CM | POA: Diagnosis not present

## 2020-01-23 LAB — I-STAT BETA HCG BLOOD, ED (MC, WL, AP ONLY): I-stat hCG, quantitative: <5 m[IU]/mL (ref <5)

## 2020-01-23 LAB — BASIC METABOLIC PANEL
Anion gap: 12 (ref 5–15)
BUN: 7 mg/dL (ref 6–20)
CO2: 18 mmol/L — ABNORMAL LOW (ref 22–32)
Calcium: 8.8 mg/dL — ABNORMAL LOW (ref 8.9–10.3)
Chloride: 108 mmol/L (ref 98–111)
Creatinine, Ser: 0.89 mg/dL (ref 0.44–1.00)
GFR calc Af Amer: >60 mL/min (ref >60)
GFR calc non Af Amer: >60 mL/min (ref >60)
Glucose, Bld: 161 mg/dL — ABNORMAL HIGH (ref 70–99)
Potassium: 4.5 mmol/L (ref 3.5–5.1)
Sodium: 138 mmol/L (ref 135–145)

## 2020-01-23 LAB — URINALYSIS, ROUTINE W REFLEX MICROSCOPIC
Bacteria, UA: NONE SEEN
Bilirubin Urine: NEGATIVE
Glucose, UA: NEGATIVE mg/dL
Ketones, ur: NEGATIVE mg/dL
Leukocytes,Ua: NEGATIVE
Nitrite: NEGATIVE
Protein, ur: NEGATIVE mg/dL
Specific Gravity, Urine: 1.019 (ref 1.005–1.030)
pH: 5 (ref 5.0–8.0)

## 2020-01-23 LAB — CBC
HCT: 47.6 % — ABNORMAL HIGH (ref 36.0–46.0)
Hemoglobin: 15.6 g/dL — ABNORMAL HIGH (ref 12.0–15.0)
MCH: 30.9 pg (ref 26.0–34.0)
MCHC: 32.8 g/dL (ref 30.0–36.0)
MCV: 94.3 fL (ref 80.0–100.0)
Platelets: 251 10*3/uL (ref 150–400)
RBC: 5.05 MIL/uL (ref 3.87–5.11)
RDW: 12.3 % (ref 11.5–15.5)
WBC: 7.8 10*3/uL (ref 4.0–10.5)
nRBC: 0 % (ref 0.0–0.2)

## 2020-01-23 MED ORDER — NAPROXEN SODIUM 550 MG PO TABS: 550.0000 mg | ORAL_TABLET | Freq: Two times a day (BID) | ORAL | 0 refills | Status: AC

## 2020-01-23 MED ORDER — ONDANSETRON 4 MG PO TBDP: 4.0000 mg | ORAL_TABLET | Freq: Three times a day (TID) | ORAL | 0 refills | Status: DC | PRN

## 2020-01-23 NOTE — ED Triage Notes (Signed)
Pt reports left sided flank pain into abd that started last night- hx of kidney stones. nausea no emesis.

## 2020-01-23 NOTE — Discharge Instructions (Signed)
Suspect kidney stone. Take Naproxen as prescribed as needed for pain. Take Zofran as needed as prescribed for nausea/vomiting. Return to the ER for fever, pain or vomiting not controlled with medications or other concerns. Recheck with your doctor later this week.

## 2020-01-23 NOTE — ED Provider Notes (Signed)
[facility] EMERGENCY DEPARTMENT Provider Note   CSN: [id number] Arrival date & time: 01/23/20  2542     History Chief Complaint  Patient presents with  . Flank Pain    Stacey Morales is a 34 y.o. female.  34 year old female with past medical history of diabetes, GERD, hyperlipidemia, kidney stones presents with complaint of left flank pain onset yesterday.  Patient reports dull pain yesterday became more severe today while at work and had to leave work due to worsening pain.  Patient states when the lobby pain became even more severe was associated with nausea without vomiting.  Pain has significantly improved at this time although slight pain still present.  No longer feeling nauseous, no vomiting, denies fevers or chills.  Patient reports urinary urgency without dysuria or hematuria.  Denies vaginal discharge, no changes in bowel habits.  Patient reports prior kidney stones, not requiring lithotripsy or other intervention.  No other complaints or concerns today.        Past Medical History:  Diagnosis Date  . Diabetes ([place])   . GERD (gastroesophageal reflux disease)   . HLD (hyperlipidemia)   . Kidney stone     Patient Active Problem List   Diagnosis Date Noted  . Migraine without aura and without status migrainosus, not intractable 09/28/2018  . Reactive airway disease 09/28/2018  . Nonintractable episodic headache 09/28/2018  . Dyspepsia 09/28/2018  . Situational mixed anxiety and depressive disorder 09/28/2018  . H. pylori infection 08/10/2018  . Type 2 diabetes mellitus with other specified complication ([place]) 70/62/3762  . Hyperlipidemia associated with type 2 diabetes mellitus ([place]) 08/10/2018  . Polycythemia 08/10/2018  . Female infertility 09/12/2017  . PCOS (polycystic ovarian syndrome), with polycystic ovaries 09/12/2017  . Vitamin D deficiency 09/12/2017  . Class 1 obesity with serious comorbidity and body mass index (BMI) of 31.0 to 31.9 in  adult 07/01/2017    Past Surgical History:  Procedure Laterality Date  . ARM SURGERY Right      OB History    Gravida  0   Para  0   Term  0   Preterm  0   AB  0   Living  0     SAB  0   TAB  0   Ectopic  0   Multiple  0   Live Births  0           Family History  Problem Relation Age of Onset  . Obesity Mother   . Kidney disease Mother   . Diabetes Mother   . Diabetes Maternal Aunt   . Diabetes Maternal Uncle   . Diabetes Maternal Grandmother   . Heart attack Maternal Grandmother   . Diabetes Maternal Grandfather     Social History   Tobacco Use  . Smoking status: Former Smoker    Years: 14.00    Types: Cigarettes    Quit date: 02/11/2017    Years since quitting: 2.9  . Smokeless tobacco: Never Used  Substance Use Topics  . Alcohol use: Yes    Comment: occasionally   . Drug use: No    Home Medications Prior to Admission medications   Medication Sig Start Date End Date Taking? Authorizing Provider  albuterol (VENTOLIN HFA) 108 (90 Base) MCG/ACT inhaler INHALE 2 PUFFS INTO THE LUNGS EVERY 6 HOURS AS NEEDED FOR WHEEZING OR SHORTNESS OF BREATH 09/21/19   Leamon Arnt, MD  fluticasone (FLOVENT HFA) 110 MCG/ACT inhaler Inhale 2 puffs into  the lungs 2 (two) times daily. 07/26/19   Helane Rima, DO  folic acid (FOLVITE) 400 MCG tablet Take 1 tablet (400 mcg total) by mouth daily. 09/28/18   Helane Rima, DO  human chorionic gonadotropin (PREGNYL/NOVAREL) 10000 units injection  04/03/19   [provider]  letrozole [facility]) 2.5 MG tablet  02/28/19   [provider]  medroxyPROGESTERone (PROVERA) 10 MG tablet Take 10 mg by mouth daily. 02/15/19   [provider]  metFORMIN (GLUCOPHAGE-XR) 750 MG 24 hr tablet Take 1 tablet (750 mg total) by mouth daily with breakfast. 07/26/19   Helane Rima, DO  naproxen sodium (ANAPROX) 550 MG tablet Take 1 tablet (550 mg total) by mouth 2 (two) times daily with a meal for 10 days. 01/23/20  02/02/20  Jeannie Fend, PA-C  ondansetron (ZOFRAN ODT) 4 MG disintegrating tablet Take 1 tablet (4 mg total) by mouth every 8 (eight) hours as needed for nausea or vomiting. 01/23/20   Army Melia A, PA-C  pioglitazone (ACTOS) 30 MG tablet Take 30 mg by mouth daily. 02/15/19   [provider]  Semaglutide (RYBELSUS) 3 MG TABS Take 1 tablet by mouth daily. 10/16/19   Willow Ora, MD    Allergies    Patient has no known allergies.  Review of Systems   Review of Systems  Constitutional: Negative for fever.  Gastrointestinal: Positive for nausea. Negative for blood in stool, constipation, diarrhea and vomiting.  Genitourinary: Positive for flank pain and urgency. Negative for dysuria, vaginal bleeding and vaginal discharge.  Musculoskeletal: Negative for gait problem.  Skin: Negative for rash and wound.  Allergic/Immunologic: Positive for immunocompromised state.  Neurological: Negative for weakness.  All other systems reviewed and are negative.   Physical Exam Updated Vital Signs BP (!) 135/93 (BP Location: Right Arm)   Pulse 66   Temp 98.4 F (36.9 C) (Oral)   Resp 17   Ht 5\' 1"  (1.549 m)   Wt 77.6 kg   SpO2 100%   BMI 32.31 kg/m   Physical Exam Vitals and nursing note reviewed.  Constitutional:      General: She is not in acute distress.    Appearance: She is well-developed. She is not diaphoretic.  HENT:     Head: Normocephalic and atraumatic.  Cardiovascular:     Rate and Rhythm: Normal rate and regular rhythm.     Heart sounds: Normal heart sounds.  Pulmonary:     Effort: Pulmonary effort is normal.     Breath sounds: Normal breath sounds.  Abdominal:     Palpations: Abdomen is soft.     Tenderness: There is no abdominal tenderness. There is no right CVA tenderness or left CVA tenderness.  Musculoskeletal:     Right lower leg: No edema.     Left lower leg: No edema.  Skin:    General: Skin is warm and dry.     Findings: No erythema or rash.    Neurological:     Mental Status: She is alert and oriented to person, place, and time.  Psychiatric:        Behavior: Behavior normal.     ED Results / Procedures / Treatments   Labs (all labs ordered are listed, but only abnormal results are displayed) Labs Reviewed  URINALYSIS, ROUTINE W REFLEX MICROSCOPIC - Abnormal; Notable for the following components:      Result Value   Hgb urine dipstick SMALL (*)    All other components within normal limits  BASIC  METABOLIC PANEL - Abnormal; Notable for the following components:   CO2 18 (*)    Glucose, Bld 161 (*)    Calcium 8.8 (*)    All other components within normal limits  CBC - Abnormal; Notable for the following components:   Hemoglobin 15.6 (*)    HCT 47.6 (*)    All other components within normal limits  I-STAT BETA HCG BLOOD, ED ([facility], [facility], [facility] ONLY)    EKG None  Radiology No results found.  Procedures Procedures (including critical care time)  Medications Ordered in ED Medications - No data to display  ED Course  I have reviewed the triage vital signs and the nursing notes.  Pertinent labs & imaging results that were available during my care of the patient were reviewed by me and considered in my medical decision making (see chart for details).  Clinical Course as of Jan 22 1253  Tue Jan 23, 2020  [id number] 34 year old female with history of kidney stones presents with complaint of left flank pain onset yesterday, has significantly improved by time of exam and has resolved on recheck. UA with small hgn with 21-50RBCs, CBC with normal WBC, BMP with normal Cr. Hcg negative. No abdominal tenderness, no CVA tenderness. Suspect kidney stone, possibly passed by time of exam. Given rx for naproxen and zofran to use should pain/nausea return. Return to ER for fever, pain, vomiting. Otherwise, recheck with PCP this week if needed.   [LM]    Clinical Course User Index [LM] Alden Hipp   MDM Rules/Calculators/A&P                       Final Clinical Impression(s) / ED Diagnoses Final diagnoses:  Left flank pain  Hematuria, unspecified type    Rx / DC Orders ED Discharge Orders         Ordered    naproxen sodium (ANAPROX) 550 MG tablet  2 times daily with meals     01/23/20 1245    ondansetron (ZOFRAN ODT) 4 MG disintegrating tablet  Every 8 hours PRN     01/23/20 1245           Kyra, Laffey, PA-C 01/23/20 1254    Melene Plan, DO 01/23/20 1259

## 2020-02-16 DIAGNOSIS — Z23 Encounter for immunization: Secondary | ICD-10-CM | POA: Diagnosis not present

## 2020-02-16 DIAGNOSIS — Z20828 Contact with and (suspected) exposure to other viral communicable diseases: Secondary | ICD-10-CM | POA: Diagnosis not present

## 2020-02-23 DIAGNOSIS — Z20828 Contact with and (suspected) exposure to other viral communicable diseases: Secondary | ICD-10-CM | POA: Diagnosis not present

## 2020-03-01 DIAGNOSIS — Z20828 Contact with and (suspected) exposure to other viral communicable diseases: Secondary | ICD-10-CM | POA: Diagnosis not present

## 2020-04-01 DIAGNOSIS — G43909 Migraine, unspecified, not intractable, without status migrainosus: Secondary | ICD-10-CM | POA: Diagnosis not present

## 2020-04-01 DIAGNOSIS — E1165 Type 2 diabetes mellitus with hyperglycemia: Secondary | ICD-10-CM | POA: Diagnosis not present

## 2020-04-01 DIAGNOSIS — K5900 Constipation, unspecified: Secondary | ICD-10-CM | POA: Diagnosis not present

## 2020-04-03 DIAGNOSIS — E559 Vitamin D deficiency, unspecified: Secondary | ICD-10-CM | POA: Diagnosis not present

## 2020-04-03 DIAGNOSIS — K5900 Constipation, unspecified: Secondary | ICD-10-CM | POA: Diagnosis not present

## 2020-04-03 DIAGNOSIS — E1165 Type 2 diabetes mellitus with hyperglycemia: Secondary | ICD-10-CM | POA: Diagnosis not present

## 2020-04-03 DIAGNOSIS — J45909 Unspecified asthma, uncomplicated: Secondary | ICD-10-CM | POA: Diagnosis not present

## 2020-04-12 DIAGNOSIS — F411 Generalized anxiety disorder: Secondary | ICD-10-CM | POA: Diagnosis not present

## 2020-04-12 DIAGNOSIS — G4700 Insomnia, unspecified: Secondary | ICD-10-CM | POA: Diagnosis not present

## 2020-04-12 DIAGNOSIS — F331 Major depressive disorder, recurrent, moderate: Secondary | ICD-10-CM | POA: Diagnosis not present

## 2020-04-29 DIAGNOSIS — Z1159 Encounter for screening for other viral diseases: Secondary | ICD-10-CM | POA: Diagnosis not present

## 2020-04-29 DIAGNOSIS — Z0000 Encounter for general adult medical examination without abnormal findings: Secondary | ICD-10-CM | POA: Diagnosis not present

## 2020-04-29 DIAGNOSIS — E782 Mixed hyperlipidemia: Secondary | ICD-10-CM | POA: Diagnosis not present

## 2020-05-02 DIAGNOSIS — E782 Mixed hyperlipidemia: Secondary | ICD-10-CM | POA: Diagnosis not present

## 2020-05-02 DIAGNOSIS — Z1151 Encounter for screening for human papillomavirus (HPV): Secondary | ICD-10-CM | POA: Diagnosis not present

## 2020-05-02 DIAGNOSIS — Z01419 Encounter for gynecological examination (general) (routine) without abnormal findings: Secondary | ICD-10-CM | POA: Diagnosis not present

## 2020-05-02 DIAGNOSIS — F3281 Premenstrual dysphoric disorder: Secondary | ICD-10-CM | POA: Diagnosis not present

## 2020-05-02 DIAGNOSIS — Z0000 Encounter for general adult medical examination without abnormal findings: Secondary | ICD-10-CM | POA: Diagnosis not present

## 2020-05-02 DIAGNOSIS — Z113 Encounter for screening for infections with a predominantly sexual mode of transmission: Secondary | ICD-10-CM | POA: Diagnosis not present

## 2020-05-02 DIAGNOSIS — N926 Irregular menstruation, unspecified: Secondary | ICD-10-CM | POA: Diagnosis not present

## 2020-05-02 DIAGNOSIS — Z1211 Encounter for screening for malignant neoplasm of colon: Secondary | ICD-10-CM | POA: Diagnosis not present

## 2020-05-02 DIAGNOSIS — Z118 Encounter for screening for other infectious and parasitic diseases: Secondary | ICD-10-CM | POA: Diagnosis not present

## 2020-05-24 DIAGNOSIS — F331 Major depressive disorder, recurrent, moderate: Secondary | ICD-10-CM | POA: Diagnosis not present

## 2020-05-24 DIAGNOSIS — G43909 Migraine, unspecified, not intractable, without status migrainosus: Secondary | ICD-10-CM | POA: Diagnosis not present

## 2020-05-24 DIAGNOSIS — G4700 Insomnia, unspecified: Secondary | ICD-10-CM | POA: Diagnosis not present

## 2020-05-24 DIAGNOSIS — F411 Generalized anxiety disorder: Secondary | ICD-10-CM | POA: Diagnosis not present

## 2020-06-04 DIAGNOSIS — Z23 Encounter for immunization: Secondary | ICD-10-CM | POA: Diagnosis not present

## 2020-07-05 DIAGNOSIS — E782 Mixed hyperlipidemia: Secondary | ICD-10-CM | POA: Diagnosis not present

## 2020-07-05 DIAGNOSIS — J4540 Moderate persistent asthma, uncomplicated: Secondary | ICD-10-CM | POA: Diagnosis not present

## 2020-07-05 DIAGNOSIS — E1165 Type 2 diabetes mellitus with hyperglycemia: Secondary | ICD-10-CM | POA: Diagnosis not present

## 2020-08-01 DIAGNOSIS — E1165 Type 2 diabetes mellitus with hyperglycemia: Secondary | ICD-10-CM | POA: Diagnosis not present

## 2020-08-01 DIAGNOSIS — E782 Mixed hyperlipidemia: Secondary | ICD-10-CM | POA: Diagnosis not present

## 2020-08-05 DIAGNOSIS — E782 Mixed hyperlipidemia: Secondary | ICD-10-CM | POA: Diagnosis not present

## 2020-08-05 DIAGNOSIS — E1169 Type 2 diabetes mellitus with other specified complication: Secondary | ICD-10-CM | POA: Diagnosis not present

## 2020-08-05 DIAGNOSIS — F411 Generalized anxiety disorder: Secondary | ICD-10-CM | POA: Diagnosis not present

## 2020-08-05 DIAGNOSIS — F331 Major depressive disorder, recurrent, moderate: Secondary | ICD-10-CM | POA: Diagnosis not present

## 2020-08-27 DIAGNOSIS — E669 Obesity, unspecified: Secondary | ICD-10-CM | POA: Diagnosis not present

## 2020-08-27 DIAGNOSIS — E782 Mixed hyperlipidemia: Secondary | ICD-10-CM | POA: Diagnosis not present

## 2020-08-27 DIAGNOSIS — E1165 Type 2 diabetes mellitus with hyperglycemia: Secondary | ICD-10-CM | POA: Diagnosis not present

## 2020-08-27 DIAGNOSIS — Z6832 Body mass index (BMI) 32.0-32.9, adult: Secondary | ICD-10-CM | POA: Diagnosis not present

## 2020-10-17 DIAGNOSIS — Z20828 Contact with and (suspected) exposure to other viral communicable diseases: Secondary | ICD-10-CM | POA: Diagnosis not present

## 2020-10-18 DIAGNOSIS — Z20828 Contact with and (suspected) exposure to other viral communicable diseases: Secondary | ICD-10-CM | POA: Diagnosis not present

## 2020-10-28 DIAGNOSIS — E1165 Type 2 diabetes mellitus with hyperglycemia: Secondary | ICD-10-CM | POA: Diagnosis not present

## 2020-10-28 DIAGNOSIS — F411 Generalized anxiety disorder: Secondary | ICD-10-CM | POA: Diagnosis not present

## 2020-10-28 DIAGNOSIS — G4700 Insomnia, unspecified: Secondary | ICD-10-CM | POA: Diagnosis not present

## 2020-10-28 DIAGNOSIS — F331 Major depressive disorder, recurrent, moderate: Secondary | ICD-10-CM | POA: Diagnosis not present

## 2020-11-05 DIAGNOSIS — N978 Female infertility of other origin: Secondary | ICD-10-CM | POA: Diagnosis not present

## 2020-11-05 DIAGNOSIS — E282 Polycystic ovarian syndrome: Secondary | ICD-10-CM | POA: Diagnosis not present

## 2020-11-05 DIAGNOSIS — N970 Female infertility associated with anovulation: Secondary | ICD-10-CM | POA: Diagnosis not present

## 2020-11-05 DIAGNOSIS — E288 Other ovarian dysfunction: Secondary | ICD-10-CM | POA: Diagnosis not present

## 2020-11-05 DIAGNOSIS — Z319 Encounter for procreative management, unspecified: Secondary | ICD-10-CM | POA: Diagnosis not present

## 2020-11-05 DIAGNOSIS — Z3161 Procreative counseling and advice using natural family planning: Secondary | ICD-10-CM | POA: Diagnosis not present

## 2020-12-23 DIAGNOSIS — N978 Female infertility of other origin: Secondary | ICD-10-CM | POA: Diagnosis not present

## 2020-12-23 DIAGNOSIS — E282 Polycystic ovarian syndrome: Secondary | ICD-10-CM | POA: Diagnosis not present

## 2020-12-23 DIAGNOSIS — N970 Female infertility associated with anovulation: Secondary | ICD-10-CM | POA: Diagnosis not present

## 2020-12-23 DIAGNOSIS — Z3161 Procreative counseling and advice using natural family planning: Secondary | ICD-10-CM | POA: Diagnosis not present

## 2020-12-23 DIAGNOSIS — E288 Other ovarian dysfunction: Secondary | ICD-10-CM | POA: Diagnosis not present

## 2020-12-23 DIAGNOSIS — Z319 Encounter for procreative management, unspecified: Secondary | ICD-10-CM | POA: Diagnosis not present

## 2021-01-22 DIAGNOSIS — Z3189 Encounter for other procreative management: Secondary | ICD-10-CM | POA: Diagnosis not present

## 2021-01-25 DIAGNOSIS — Z3189 Encounter for other procreative management: Secondary | ICD-10-CM | POA: Diagnosis not present

## 2021-02-18 DIAGNOSIS — F411 Generalized anxiety disorder: Secondary | ICD-10-CM | POA: Diagnosis not present

## 2021-02-18 DIAGNOSIS — G43909 Migraine, unspecified, not intractable, without status migrainosus: Secondary | ICD-10-CM | POA: Diagnosis not present

## 2021-02-18 DIAGNOSIS — R079 Chest pain, unspecified: Secondary | ICD-10-CM | POA: Diagnosis not present

## 2021-02-18 DIAGNOSIS — R519 Headache, unspecified: Secondary | ICD-10-CM | POA: Diagnosis not present

## 2021-02-20 DIAGNOSIS — Z3141 Encounter for fertility testing: Secondary | ICD-10-CM | POA: Diagnosis not present

## 2021-02-20 DIAGNOSIS — Z3189 Encounter for other procreative management: Secondary | ICD-10-CM | POA: Diagnosis not present

## 2021-02-24 DIAGNOSIS — Z3189 Encounter for other procreative management: Secondary | ICD-10-CM | POA: Diagnosis not present

## 2021-03-21 DIAGNOSIS — Z3189 Encounter for other procreative management: Secondary | ICD-10-CM | POA: Diagnosis not present

## 2021-03-26 DIAGNOSIS — Z3189 Encounter for other procreative management: Secondary | ICD-10-CM | POA: Diagnosis not present

## 2021-04-09 DIAGNOSIS — F411 Generalized anxiety disorder: Secondary | ICD-10-CM | POA: Diagnosis not present

## 2021-04-09 DIAGNOSIS — F331 Major depressive disorder, recurrent, moderate: Secondary | ICD-10-CM | POA: Diagnosis not present

## 2021-04-09 DIAGNOSIS — Z7185 Encounter for immunization safety counseling: Secondary | ICD-10-CM | POA: Diagnosis not present

## 2021-04-09 DIAGNOSIS — R079 Chest pain, unspecified: Secondary | ICD-10-CM | POA: Diagnosis not present

## 2023-02-05 ENCOUNTER — Inpatient Hospital Stay (HOSPITAL_COMMUNITY): Payer: BLUE CROSS/BLUE SHIELD

## 2023-02-05 ENCOUNTER — Inpatient Hospital Stay (HOSPITAL_COMMUNITY)
Admission: AD | Admit: 2023-02-05 | Discharge: 2023-02-05 | Disposition: A | Discharge status: Home / Self Care | Payer: BLUE CROSS/BLUE SHIELD | Attending: Obstetrics and Gynecology | Admitting: Obstetrics and Gynecology

## 2023-02-05 ENCOUNTER — Encounter (HOSPITAL_COMMUNITY): Payer: Self-pay | Admitting: *Deleted

## 2023-02-05 DIAGNOSIS — O468X1 Other antepartum hemorrhage, first trimester: Secondary | ICD-10-CM

## 2023-02-05 DIAGNOSIS — O24311 Unspecified pre-existing diabetes mellitus in pregnancy, first trimester: Secondary | ICD-10-CM | POA: Insufficient documentation

## 2023-02-05 DIAGNOSIS — R109 Unspecified abdominal pain: Secondary | ICD-10-CM | POA: Insufficient documentation

## 2023-02-05 DIAGNOSIS — Z3A08 8 weeks gestation of pregnancy: Secondary | ICD-10-CM | POA: Diagnosis not present

## 2023-02-05 DIAGNOSIS — O26891 Other specified pregnancy related conditions, first trimester: Secondary | ICD-10-CM | POA: Insufficient documentation

## 2023-02-05 DIAGNOSIS — O418X10 Other specified disorders of amniotic fluid and membranes, first trimester, not applicable or unspecified: Secondary | ICD-10-CM | POA: Diagnosis not present

## 2023-02-05 DIAGNOSIS — Z833 Family history of diabetes mellitus: Secondary | ICD-10-CM | POA: Diagnosis not present

## 2023-02-05 DIAGNOSIS — Z3491 Encounter for supervision of normal pregnancy, unspecified, first trimester: Secondary | ICD-10-CM

## 2023-02-05 DIAGNOSIS — O26899 Other specified pregnancy related conditions, unspecified trimester: Secondary | ICD-10-CM

## 2023-02-05 DIAGNOSIS — O208 Other hemorrhage in early pregnancy: Secondary | ICD-10-CM | POA: Insufficient documentation

## 2023-02-05 LAB — WET PREP, GENITAL
Clue Cells Wet Prep HPF POC: NONE SEEN
Sperm: NONE SEEN
Trich, Wet Prep: NONE SEEN
WBC, Wet Prep HPF POC: <10 (ref <10)
Yeast Wet Prep HPF POC: NONE SEEN

## 2023-02-05 LAB — CBC
HCT: 46.5 % — ABNORMAL HIGH (ref 36.0–46.0)
Hemoglobin: 15.6 g/dL — ABNORMAL HIGH (ref 12.0–15.0)
MCH: 31.1 pg (ref 26.0–34.0)
MCHC: 33.5 g/dL (ref 30.0–36.0)
MCV: 92.8 fL (ref 80.0–100.0)
Platelets: 254 10*3/uL (ref 150–400)
RBC: 5.01 MIL/uL (ref 3.87–5.11)
RDW: 12.6 % (ref 11.5–15.5)
WBC: 11.8 10*3/uL — ABNORMAL HIGH (ref 4.0–10.5)
nRBC: 0 % (ref 0.0–0.2)

## 2023-02-05 LAB — URINALYSIS, ROUTINE W REFLEX MICROSCOPIC
Bilirubin Urine: NEGATIVE
Glucose, UA: NEGATIVE mg/dL
Hgb urine dipstick: NEGATIVE
Ketones, ur: NEGATIVE mg/dL
Leukocytes,Ua: NEGATIVE
Nitrite: NEGATIVE
Protein, ur: NEGATIVE mg/dL
Specific Gravity, Urine: 1.01 (ref 1.005–1.030)
pH: 7 (ref 5.0–8.0)

## 2023-02-05 LAB — POCT PREGNANCY, URINE: Preg Test, Ur: POSITIVE — AB

## 2023-02-05 LAB — ABO/RH: ABO/RH(D): O POS

## 2023-02-05 LAB — HCG, QUANTITATIVE, PREGNANCY: hCG, Beta Chain, Quant, S: 63067 m[IU]/mL — ABNORMAL HIGH (ref <5)

## 2023-02-05 NOTE — MAU Provider Note (Signed)
History     [id number]  Arrival date and time: 02/05/23 4098    Chief Complaint  Patient presents with   Abdominal Pain     HPI Stacey Morales is a 37 y.o. G2P0010 at [redacted]w[redacted]d by LMP who presents for abdominal cramping. Reports intermittent lower abdominal cramping for the last few weeks. Last week had one episode of vaginal bleeding that did not continued. Has also had some issues with constipation. Last BM was this morning. Denies n/v/d, dysuria, vaginal discharge, or vaginal irritation. Does not have OB yet.    OB History     Gravida  2   Para  0   Term  0   Preterm  0   AB  1   Living         SAB  1   IAB  0   Ectopic  0   Multiple  0   Live Births              Past Medical History:  Diagnosis Date   Diabetes (HCC)    GERD (gastroesophageal reflux disease)    HLD (hyperlipidemia)    Kidney stone     Past Surgical History:  Procedure Laterality Date   ARM SURGERY Right     Family History  Problem Relation Age of Onset   Obesity Mother    Kidney disease Mother    Diabetes Mother    Diabetes Maternal Aunt    Diabetes Maternal Uncle    Diabetes Maternal Grandmother    Heart attack Maternal Grandmother    Diabetes Maternal Grandfather     Social History   Socioeconomic History   Marital status: Married    Spouse name: Madaline Guthrie   Number of children: 0   Years of education: Not on file   Highest education level: Not on file  Occupational History   Occupation: CAD Designer, television/film set    Employer: [employer]  Tobacco Use   Smoking status: Former    Years: 14    Types: Cigarettes    Quit date: 02/11/2017    Years since quitting: 5.9   Smokeless tobacco: Never  Vaping Use   Vaping Use: Never used  Substance and Sexual Activity   Alcohol use: Yes    Comment: occasionally    Drug use: No   Sexual activity: Yes    Partners: Male    Birth control/protection: None    Comment: 1st intercourse- 16, partners- 3   Other Topics  Concern   Not on file  Social History Narrative   Not on file   Social Determinants of Health   Financial Resource Strain: Not on file  Food Insecurity: Not on file  Transportation Needs: Not on file  Physical Activity: Not on file  Stress: Not on file  Social Connections: Not on file  Intimate Partner Violence: Not on file    No Known Allergies  No current facility-administered medications on file prior to encounter.   Current Outpatient Medications on File Prior to Encounter  Medication Sig Dispense Refill   metFORMIN (GLUCOPHAGE) 1000 MG tablet Take 1,000 mg by mouth 2 (two) times daily.       ROS Pertinent positives and negative per HPI, all others reviewed and negative  Physical Exam   BP 126/80 (BP Location: Right Arm)   Pulse 67   Temp 98.1 F (36.7 C) (Oral)   Resp 15   Ht 5\' 1"  (1.549 m)   Wt 76.7 kg  LMP 12/09/2022 (Approximate)   SpO2 100%   BMI 31.95 kg/m   Patient Vitals for the past 24 hrs:  BP Temp Temp src Pulse Resp SpO2 Height Weight  02/05/23 1021 126/80 98.1 F (36.7 C) Oral 67 15 100 % 5\' 1"  (1.549 m) 76.7 kg    Physical Exam Vitals and nursing note reviewed.  Constitutional:      General: She is not in acute distress.    Appearance: She is well-developed. She is not ill-appearing.  HENT:     Head: Normocephalic and atraumatic.  Eyes:     General: No scleral icterus.       Right eye: No discharge.        Left eye: No discharge.     Conjunctiva/sclera: Conjunctivae normal.  Pulmonary:     Effort: Pulmonary effort is normal. No respiratory distress.  Neurological:     General: No focal deficit present.     Mental Status: She is alert.  Psychiatric:        Mood and Affect: Mood normal.        Behavior: Behavior normal.       Labs Results for orders placed or performed during the hospital encounter of 02/05/23 (from the past 24 hour(s))  Pregnancy, urine POC     Status: Abnormal   Collection Time: 02/05/23 10:02 AM  Result  Value Ref Range   Preg Test, Ur POSITIVE (A) NEGATIVE  Urinalysis, Routine w reflex microscopic -Urine, Clean Catch     Status: Abnormal   Collection Time: 02/05/23 10:02 AM  Result Value Ref Range   Color, Urine YELLOW YELLOW   APPearance HAZY (A) CLEAR   Specific Gravity, Urine 1.010 1.005 - 1.030   pH 7.0 5.0 - 8.0   Glucose, UA NEGATIVE NEGATIVE mg/dL   Hgb urine dipstick NEGATIVE NEGATIVE   Bilirubin Urine NEGATIVE NEGATIVE   Ketones, ur NEGATIVE NEGATIVE mg/dL   Protein, ur NEGATIVE NEGATIVE mg/dL   Nitrite NEGATIVE NEGATIVE   Leukocytes,Ua NEGATIVE NEGATIVE  ABO/Rh     Status: None   Collection Time: 02/05/23 11:09 AM  Result Value Ref Range   ABO/RH(D) O POS    No rh immune globuloin      NOT A RH IMMUNE GLOBULIN CANDIDATE, PT RH POSITIVE Performed at [facility], [address], [place], [place] [address]   CBC     Status: Abnormal   Collection Time: 02/05/23 11:11 AM  Result Value Ref Range   WBC 11.8 (H) 4.0 - 10.5 K/uL   RBC 5.01 3.87 - 5.11 MIL/uL   Hemoglobin 15.6 (H) 12.0 - 15.0 g/dL   HCT 60.4 (H) 54.0 - 98.1 %   MCV 92.8 80.0 - 100.0 fL   MCH 31.1 26.0 - 34.0 pg   MCHC 33.5 30.0 - 36.0 g/dL   RDW 19.1 47.8 - 29.5 %   Platelets 254 150 - 400 K/uL   nRBC 0.0 0.0 - 0.2 %  hCG, quantitative, pregnancy     Status: Abnormal   Collection Time: 02/05/23 11:11 AM  Result Value Ref Range   hCG, Beta Chain, Quant, S 63,067 (H) <5 mIU/mL  Wet prep, genital     Status: None   Collection Time: 02/05/23 11:11 AM   Specimen: PATH Cytology Cervicovaginal Ancillary Only  Result Value Ref Range   Yeast Wet Prep HPF POC NONE SEEN NONE SEEN   Trich, Wet Prep NONE SEEN NONE SEEN   Clue Cells Wet Prep HPF POC NONE  SEEN NONE SEEN   WBC, Wet Prep HPF POC <10 <10   Sperm NONE SEEN     Imaging US OB LESS THAN 14 WEEKS WITH OB TRANSVAGINAL  Result Date: 02/05/2023 CLINICAL DATA:  Abdominal cramping. 37 year old pregnant female. Quantitative beta hCG pending. Last  menstrual period 12/09/2022, corresponding to estimated gestational age of [redacted] weeks 2 days. EXAM: OBSTETRIC <14 WK [place] AND TRANSVAGINAL OB US TECHNIQUE: Both transabdominal and transvaginal ultrasound examinations were performed for complete evaluation of the gestation as well as the maternal uterus, adnexal regions, and pelvic cul-de-sac. Transvaginal technique was performed to assess early pregnancy. COMPARISON:  None Available. FINDINGS: Intrauterine gestational sac: Single Yolk sac:  Not Visualized. Embryo:  Visualized. Cardiac Activity: Visualized. Heart Rate: 160 bpm CRL:  12.5 mm   7 w   4 d                  [place] EDC: 09/20/2023 Subchorionic hemorrhage: Small hypoechoic subchorionic hematoma measuring approximately 1.4 x 0.7 x 1.3 cm. Maternal uterus/adnexae: The bilateral maternal ovaries are within normal limits. No free fluid. IMPRESSION: Single live intrauterine pregnancy with crown-rump length corresponding to estimated gestational age of [redacted] weeks 4 days. This is in agreement with the last menstrual period dating of 8 weeks 2 days. Small subchronic hematoma measuring up to 1.4 cm. Recommend attention on follow-up. Electronically Signed   By: Neita Garnet M.D.   On: 02/05/2023 12:39    MAU Course  Procedures Lab Orders         Wet prep, genital         Urinalysis, Routine w reflex microscopic -Urine, Clean Catch         CBC         hCG, quantitative, pregnancy         Pregnancy, urine POC    No orders of the defined types were placed in this encounter.  Imaging Orders         US OB LESS THAN 14 WEEKS WITH OB TRANSVAGINAL      MDM Low  Assessment and Plan   1. Abdominal cramping affecting pregnancy   2. Normal IUP (intrauterine pregnancy) on prenatal ultrasound, first trimester   3. Subchorionic hematoma in first trimester, single or unspecified fetus   4. [redacted] weeks gestation of pregnancy    -Patient has some intermittent lower abdominal cramping & a single episode of vaginal bleeding  last week. She is RH positive. Ultrasound shows live IUP & small subchorionic hemorrhage. Reviewed [facility] with patient & bleeding precautions. Given list of OB providers & encouraged to start prenatal care asap due to diabetes -Patient reports that she is on metformin 2 gm. Encouraged to monitor blood sugars & continue meds until her f/u appointment    Dispo: discharged to home in stable condition.   Discharge Instructions     Discharge patient   Complete by: As directed    Discharge disposition: 01-Home or Self Care   Discharge patient date: 02/05/2023       Judeth Horn, NP 02/05/23 5:25 PM  Allergies as of 02/05/2023   No Known Allergies      Medication List     TAKE these medications    metFORMIN 1000 MG tablet Commonly known as: GLUCOPHAGE Take 1,000 mg by mouth 2 (two) times daily.

## 2023-02-05 NOTE — MAU Note (Signed)
...  Stacey Morales is a 37 y.o. at Unknown here in MAU reporting: Since 3/26 she has been experiencing breast tenderness and intermittent bilateral lower abdominal pain that feels like cramps. She reports movement makes her pains worse. She reports she wants to ensure everything okay with her pregnancy as she had one episode of bleeding on 4/16. Denies current vaginal bleeding. Endorses white discharge without odors or vaginal itching.  3/26 negative UPT with [facility]. 4/16 vaginal bleeding for an hour  4/19 four positive pregnancy tests.  LMP: 12/09/2022 Onset of complaint: - irregular Pain score: 2/10 lower abdomen  Lab orders placed from triage:  POCT Preg, UA

## 2023-02-05 NOTE — Discharge Instructions (Signed)
  [facility] Providers          [facility]  [address], [place], [place] [address]  [phone]  [facility]  [address], [place], [place] [address]  [phone]  [facility]  [address], [place], [place] [address]  [phone]  [facility] [address], [place], [place] [address] [phone]  [facility] [place]  [address], [place], [place] [address]  [phone]  [facility]  [address] floor), [place], [place] [address]  [phone]  [facility]  [address], [place], [place] [address]  [phone]  [facility]  [address], [place], [place] [address]  [phone]  [facility]  [address], [place], [place] [address]  [phone]  [facility]  [address], [place], [place] [address]  [phone]  [facility]  [address], [place], [place] [address]  [phone]  [facility]  [address], [place], [place] [address]  [phone]  [facility]   [address], [place], [place] [address]  [phone]  [facility]  [address], [place], [place] [address]   [phone]  [facility] [address], [place], [place] [address] [phone]  [facility]  [address], , [place] [address]  [phone]         

## 2023-02-08 LAB — GC/CHLAMYDIA PROBE AMP (~~LOC~~) NOT AT ARMC
Chlamydia: NEGATIVE
Comment: NEGATIVE
Comment: NORMAL
Neisseria Gonorrhea: NEGATIVE

## 2023-03-10 ENCOUNTER — Encounter: Payer: BLUE CROSS/BLUE SHIELD | Admitting: Family Medicine

## 2023-03-11 ENCOUNTER — Telehealth: Payer: BLUE CROSS/BLUE SHIELD

## 2023-03-18 ENCOUNTER — Encounter: Payer: Self-pay | Admitting: Obstetrics & Gynecology

## 2024-08-08 ENCOUNTER — Encounter (HOSPITAL_COMMUNITY): Payer: Self-pay | Admitting: *Deleted

## 2024-08-08 ENCOUNTER — Ambulatory Visit (HOSPITAL_COMMUNITY)
Admission: EM | Admit: 2024-08-08 | Discharge: 2024-08-08 | Disposition: A | Discharge status: Home / Self Care | Payer: Self-pay

## 2024-08-08 DIAGNOSIS — H0100A Unspecified blepharitis right eye, upper and lower eyelids: Secondary | ICD-10-CM

## 2024-08-08 NOTE — Discharge Instructions (Signed)
 You may use warm compresses to the affected eye 20 minutes at a time a couple times a day.  You may also use something like Allegra, Claritin, or Benadryl to help with itching and swelling of your eye.  You may be experiencing a slight allergic reaction to the oil that was in your eye.  It is reassuring that you have no changes in vision, but if for some reason you start to experience pain or change in vision or sensitivity to light that is the reason to follow-up or go to the eye doctor.

## 2024-08-08 NOTE — ED Notes (Signed)
 Eye wash completed with sterile water

## 2024-08-08 NOTE — ED Provider Notes (Signed)
 [facility]    CSN: [id number] Arrival date & time: 08/08/24  9185      History   Chief Complaint Chief Complaint  Patient presents with   Eye Problem    HPI Stacey Morales is a 38 y.o. female.   Patient presents today due to accidentally getting a massage oil in her right eye while getting a massage yesterday.  Patient states that she rinsed her eye out with water.  Patient states that she did research on the Internet to see how to best treat her eye.  Patient saw online that water and oil do not mix and that she should use a different oil to get rid of the essential oils such as olive oil or coconut oil, so she used coconut oil and rubs it around her eye.  Patient states that after doing that her eye did feel better but she woke up with swelling of her right upper and lower eyelids and slight itching.  Patient denies changes in vision, sensitivity to light, or excessive tearing from her eyes.  The history is provided by the patient.  Eye Problem   Past Medical History:  Diagnosis Date   Diabetes (HCC)    GERD (gastroesophageal reflux disease)    HLD (hyperlipidemia)    Kidney stone     Patient Active Problem List   Diagnosis Date Noted   Migraine without aura and without status migrainosus, not intractable 09/28/2018   Reactive airway disease 09/28/2018   Nonintractable episodic headache 09/28/2018   Dyspepsia 09/28/2018   Situational mixed anxiety and depressive disorder 09/28/2018   H. pylori infection 08/10/2018   Type 2 diabetes mellitus with other specified complication (HCC) 08/10/2018   Hyperlipidemia associated with type 2 diabetes mellitus (HCC) 08/10/2018   Polycythemia 08/10/2018   Female infertility 09/12/2017   PCOS (polycystic ovarian syndrome), with polycystic ovaries 09/12/2017   Class 1 obesity with serious comorbidity and body mass index (BMI) of 31.0 to 31.9 in adult 07/01/2017    Past Surgical History:  Procedure Laterality  Date   ARM SURGERY Right     OB History     Gravida  2   Para  0   Term  0   Preterm  0   AB  1   Living         SAB  1   IAB  0   Ectopic  0   Multiple  0   Live Births               Home Medications    Prior to Admission medications   Medication Sig Start Date End Date Taking? Authorizing Provider  busPIRone HCl (BUSPAR PO) Take by mouth.   Yes [provider]  metFORMIN  (GLUCOPHAGE ) 1000 MG tablet Take 1,000 mg by mouth 2 (two) times daily. 01/27/23  Yes [provider]    Family History Family History  Problem Relation Age of Onset   Obesity Mother    Kidney disease Mother    Diabetes Mother    Diabetes Maternal Aunt    Diabetes Maternal Uncle    Diabetes Maternal Grandmother    Heart attack Maternal Grandmother    Diabetes Maternal Grandfather     Social History Social History   Tobacco Use   Smoking status: Former    Current packs/day: 0.00    Types: Cigarettes    Start date: 02/12/2003    Quit date: 02/11/2017    Years since quitting: 7.4  Smokeless tobacco: Never  Vaping Use   Vaping status: Never Used  Substance Use Topics   Alcohol use: Yes    Comment: occasionally    Drug use: No     Allergies   Patient has no known allergies.   Review of Systems Review of Systems   Physical Exam Triage Vital Signs ED Triage Vitals  Encounter Vitals Group     BP 08/08/24 0918 136/89     Girls Systolic BP Percentile --      Girls Diastolic BP Percentile --      Boys Systolic BP Percentile --      Boys Diastolic BP Percentile --      Pulse --      Resp 08/08/24 0918 16     Temp 08/08/24 0918 98 F (36.7 C)     Temp Source 08/08/24 0918 Oral     SpO2 08/08/24 0918 95 %     Weight --      Height --      Head Circumference --      Peak Flow --      Pain Score 08/08/24 0916 0     Pain Loc --      Pain Education --      Exclude from Growth Chart --    No data found.  Updated Vital Signs BP 136/89 (BP  Location: Right Arm)   Temp 98 F (36.7 C) (Oral)   Resp 16   LMP 07/03/2024 (Exact Date)   SpO2 95%   Visual Acuity Right Eye Distance: 20/20 Left Eye Distance: 20/20 Bilateral Distance: 20/20  Right Eye Near:   Left Eye Near:    Bilateral Near:     Physical Exam Vitals and nursing note reviewed.  Constitutional:      General: She is not in acute distress.    Appearance: Normal appearance. She is not ill-appearing, toxic-appearing or diaphoretic.  Eyes:     General: No scleral icterus.       Right eye: No discharge.        Left eye: No discharge.     Extraocular Movements: Extraocular movements intact.     Conjunctiva/sclera: Conjunctivae normal.     Pupils: Pupils are equal, round, and reactive to light.     Comments: Mild edema noted of right upper and lower eyelids.  No tenderness to palpation, no erythema noted  Cardiovascular:     Rate and Rhythm: Normal rate and regular rhythm.     Heart sounds: Normal heart sounds.  Pulmonary:     Effort: Pulmonary effort is normal. No respiratory distress.     Breath sounds: Normal breath sounds. No wheezing or rhonchi.  Skin:    General: Skin is warm.  Neurological:     Mental Status: She is alert and oriented to person, place, and time.  Psychiatric:        Mood and Affect: Mood normal.        Behavior: Behavior normal.      UC Treatments / Results  Labs (all labs ordered are listed, but only abnormal results are displayed) Labs Reviewed - No data to display  EKG   Radiology No results found.  Procedures Procedures (including critical care time)  Medications Ordered in UC Medications - No data to display  Initial Impression / Assessment and Plan / UC Course  I have reviewed the triage vital signs and the nursing notes.  Pertinent labs & imaging results that were available during my care  of the patient were reviewed by me and considered in my medical decision making (see chart for details).      Blepharitis of right upper and lower eyelids.  Visual acuity is intact.  Patient advised to use warm compresses to eyelid and to use Benadryl and/or Claritin, Allegra, or Zyrtec for itching and edema.  Patient advised that if her vision changes or she experiences sensitivity to light that she needs to report to the eye doctor. Final Clinical Impressions(s) / UC Diagnoses   Final diagnoses:  Blepharitis of both upper and lower eyelid of right eye, unspecified type     Discharge Instructions      You may use warm compresses to the affected eye 20 minutes at a time a couple times a day.  You may also use something like Allegra, Claritin, or Benadryl to help with itching and swelling of your eye.  You may be experiencing a slight allergic reaction to the oil that was in your eye.  It is reassuring that you have no changes in vision, but if for some reason you start to experience pain or change in vision or sensitivity to light that is the reason to follow-up or go to the eye doctor.    ED Prescriptions   None    PDMP not reviewed this encounter.   Andra Corean BROCKS, PA-C 08/08/24 1022

## 2024-08-08 NOTE — ED Triage Notes (Signed)
 Pt states she was doing a massage last night with ginger massage oil and she touched her right eye since has had some swelling. She washed the eye out with coconut oil and water but still has some irritation.
